# Patient Record
Sex: Female | Born: 1980 | Race: White | Hispanic: No | Smoking: Former smoker
Health system: Southern US, Community
[De-identification: ages and names within clinical notes are randomized; demographics above are authoritative.]

## PROBLEM LIST (undated history)

## (undated) DIAGNOSIS — R569 Unspecified convulsions: Secondary | ICD-10-CM

## (undated) DIAGNOSIS — F32A Depression, unspecified: Secondary | ICD-10-CM

## (undated) DIAGNOSIS — F419 Anxiety disorder, unspecified: Secondary | ICD-10-CM

## (undated) DIAGNOSIS — K859 Acute pancreatitis without necrosis or infection, unspecified: Secondary | ICD-10-CM

## (undated) DIAGNOSIS — N289 Disorder of kidney and ureter, unspecified: Secondary | ICD-10-CM

## (undated) DIAGNOSIS — K5790 Diverticulosis of intestine, part unspecified, without perforation or abscess without bleeding: Secondary | ICD-10-CM

## (undated) DIAGNOSIS — E2740 Unspecified adrenocortical insufficiency: Secondary | ICD-10-CM

## (undated) DIAGNOSIS — F329 Major depressive disorder, single episode, unspecified: Secondary | ICD-10-CM

## (undated) DIAGNOSIS — N159 Renal tubulo-interstitial disease, unspecified: Secondary | ICD-10-CM

## (undated) HISTORY — DX: Renal tubulo-interstitial disease, unspecified: N15.9

## (undated) HISTORY — DX: Depression, unspecified: F32.A

## (undated) HISTORY — DX: Anxiety disorder, unspecified: F41.9

## (undated) HISTORY — PX: OTHER SURGICAL HISTORY: SHX169

## (undated) HISTORY — DX: Unspecified convulsions: R56.9

## (undated) HISTORY — DX: Major depressive disorder, single episode, unspecified: F32.9

## (undated) HISTORY — PX: NO PAST SURGERIES: SHX2092

---

## 2004-11-19 ENCOUNTER — Ambulatory Visit: Payer: Self-pay | Admitting: Obstetrics & Gynecology

## 2010-05-26 DIAGNOSIS — N289 Disorder of kidney and ureter, unspecified: Secondary | ICD-10-CM

## 2010-05-26 HISTORY — DX: Disorder of kidney and ureter, unspecified: N28.9

## 2011-06-11 ENCOUNTER — Emergency Department: Payer: Self-pay | Admitting: Emergency Medicine

## 2011-06-12 LAB — PREGNANCY, URINE: Pregnancy Test, Urine: NEGATIVE m[IU]/mL

## 2011-06-12 LAB — URINALYSIS, COMPLETE
Glucose,UR: NEGATIVE mg/dL (ref 0–75)
Ketone: NEGATIVE
Protein: 30
RBC,UR: 438 /HPF (ref 0–5)
Specific Gravity: 1.014 (ref 1.003–1.030)

## 2011-06-19 ENCOUNTER — Ambulatory Visit: Payer: Self-pay | Admitting: Internal Medicine

## 2011-06-19 ENCOUNTER — Other Ambulatory Visit: Payer: Self-pay | Admitting: Internal Medicine

## 2011-06-19 LAB — BASIC METABOLIC PANEL
Creatinine: 0.6 mg/dL (ref 0.60–1.30)
EGFR (African American): >60
EGFR (Non-African Amer.): >60
Glucose: 59 mg/dL — ABNORMAL LOW (ref 65–99)
Potassium: 4.1 mmol/L (ref 3.5–5.1)
Sodium: 142 mmol/L (ref 136–145)

## 2011-11-10 ENCOUNTER — Emergency Department (HOSPITAL_COMMUNITY)
Admission: EM | Admit: 2011-11-10 | Discharge: 2011-11-11 | Disposition: A | Discharge status: Home / Self Care | Payer: Self-pay | Attending: Emergency Medicine | Admitting: Emergency Medicine

## 2011-11-10 ENCOUNTER — Encounter (HOSPITAL_COMMUNITY): Payer: Self-pay | Admitting: *Deleted

## 2011-11-10 ENCOUNTER — Emergency Department (HOSPITAL_COMMUNITY): Payer: Self-pay

## 2011-11-10 DIAGNOSIS — N201 Calculus of ureter: Secondary | ICD-10-CM

## 2011-11-10 DIAGNOSIS — R109 Unspecified abdominal pain: Secondary | ICD-10-CM

## 2011-11-10 DIAGNOSIS — F17200 Nicotine dependence, unspecified, uncomplicated: Secondary | ICD-10-CM | POA: Insufficient documentation

## 2011-11-10 LAB — URINE MICROSCOPIC-ADD ON

## 2011-11-10 LAB — DIFFERENTIAL
Eosinophils Absolute: 0.2 10*3/uL (ref 0.0–0.7)
Eosinophils Relative: 2 % (ref 0–5)
Lymphocytes Relative: 30 % (ref 12–46)
Lymphs Abs: 2.7 10*3/uL (ref 0.7–4.0)
Monocytes Absolute: 0.6 10*3/uL (ref 0.1–1.0)

## 2011-11-10 LAB — CBC
HCT: 39.6 % (ref 36.0–46.0)
MCH: 31.1 pg (ref 26.0–34.0)
MCV: 91.2 fL (ref 78.0–100.0)
Platelets: 287 10*3/uL (ref 150–400)
RBC: 4.34 MIL/uL (ref 3.87–5.11)
RDW: 13.2 % (ref 11.5–15.5)
WBC: 8.9 10*3/uL (ref 4.0–10.5)

## 2011-11-10 LAB — COMPREHENSIVE METABOLIC PANEL
BUN: 12 mg/dL (ref 6–23)
CO2: 28 mEq/L (ref 19–32)
Calcium: 9.4 mg/dL (ref 8.4–10.5)
Creatinine, Ser: 0.68 mg/dL (ref 0.50–1.10)
GFR calc Af Amer: >90 mL/min (ref >90)
GFR calc non Af Amer: >90 mL/min (ref >90)
Glucose, Bld: 73 mg/dL (ref 70–99)
Total Protein: 7 g/dL (ref 6.0–8.3)

## 2011-11-10 LAB — URINALYSIS, ROUTINE W REFLEX MICROSCOPIC
Bilirubin Urine: NEGATIVE
Hgb urine dipstick: NEGATIVE
Ketones, ur: NEGATIVE mg/dL
Nitrite: NEGATIVE
Specific Gravity, Urine: 1.015 (ref 1.005–1.030)
Urobilinogen, UA: 0.2 mg/dL (ref 0.0–1.0)

## 2011-11-10 MED ORDER — HYDROCODONE-ACETAMINOPHEN 5-500 MG PO TABS: 1.0000 | ORAL_TABLET | Freq: Four times a day (QID) | ORAL | Status: AC | PRN

## 2011-11-10 MED ORDER — KETOROLAC TROMETHAMINE 60 MG/2ML IM SOLN
60.0000 mg | Freq: Once | INTRAMUSCULAR | Status: AC
  Administered 2011-11-10: 60 mg via INTRAMUSCULAR
  Filled 2011-11-10: qty 2

## 2011-11-10 NOTE — ED Provider Notes (Signed)
I saw and evaluated the patient, reviewed the resident's note and I agree with the findings and plan.   Pt with flank pain off and on for several weeks/month. Irregular BM. Labs unremarkable. CT to eval for stone.   Martine Trageser B. Bernette Mayers, MD 11/10/11 2330

## 2011-11-10 NOTE — ED Provider Notes (Signed)
History     CSN: [id number]  Arrival date & time 11/10/11  1527   First MD Initiated Contact with Patient 11/10/11 2110      Chief Complaint  Patient presents with  . Flank Pain    (Consider location/radiation/quality/duration/timing/severity/associated sxs/prior treatment) Patient is a 31 y.o. female presenting with flank pain. The history is provided by the patient.  Flank Pain This is a new problem. The current episode started more than 1 month ago (worse the past several days). The problem occurs 2 to 4 times per day. The problem has been gradually worsening. Pertinent negatives include no chest pain, chills, coughing, fever or neck pain. Nothing aggravates the symptoms. She has tried acetaminophen for the symptoms. The treatment provided no relief.    History reviewed. No pertinent past medical history.  History reviewed. No pertinent past surgical history.  No family history on file.  History  Substance Use Topics  . Smoking status: Current Everyday Smoker  . Smokeless tobacco: Not on file  . Alcohol Use: Yes    OB History    Grav Para Term Preterm Abortions TAB SAB Ect Mult Living                  Review of Systems  Constitutional: Negative for fever, chills, activity change and appetite change.  HENT: Negative for neck pain and neck stiffness.   Respiratory: Negative for cough, chest tightness, shortness of breath and wheezing.   Cardiovascular: Negative for chest pain and palpitations.  Genitourinary: Positive for flank pain (right-sided). Negative for urgency, hematuria, decreased urine volume, difficulty urinating and menstrual problem.  Skin: Negative for wound.  Neurological: Negative for seizures, syncope and facial asymmetry.  Psychiatric/Behavioral: Negative for confusion and agitation.  All other systems reviewed and are negative.    Allergies  Sulfa antibiotics  Home Medications   Current Outpatient Rx  Name Route Sig Dispense Refill  .  ACETAMINOPHEN 325 MG PO TABS Oral Take 650 mg by mouth every 6 (six) hours as needed. For pain    . BUSPIRONE HCL 10 MG PO TABS Oral Take 10 mg by mouth 3 (three) times daily.    Marland Kitchen CITALOPRAM HYDROBROMIDE 20 MG PO TABS Oral Take 40 mg by mouth daily.      BP 121/75  Pulse 90  Temp 98.7 F (37.1 C) (Oral)  Resp 18  SpO2 100%  LMP 10/30/2011  Physical Exam  Nursing note and vitals reviewed. Constitutional: She is oriented to person, place, and time. She appears well-developed and well-nourished.  HENT:  Head: Normocephalic and atraumatic.  Right Ear: External ear normal.  Left Ear: External ear normal.  Nose: Nose normal.  Mouth/Throat: Oropharynx is clear and moist. No oropharyngeal exudate.  Eyes: Conjunctivae are normal. Pupils are equal, round, and reactive to light.  Neck: Normal range of motion. Neck supple.  Cardiovascular: Normal rate, regular rhythm, normal heart sounds and intact distal pulses.  Exam reveals no gallop and no friction rub.   No murmur heard. Pulmonary/Chest: Effort normal and breath sounds normal. No respiratory distress. She has no wheezes. She has no rales. She exhibits no tenderness.  Abdominal: Soft. Bowel sounds are normal. She exhibits no distension and no mass. There is tenderness (right flank). There is no rebound and no guarding.  Musculoskeletal: Normal range of motion. She exhibits no edema and no tenderness.  Neurological: She is alert and oriented to person, place, and time.  Skin: Skin is warm and dry.  Psychiatric: She has  a normal mood and affect.    ED Course  Procedures (including critical care time)  Labs Reviewed  URINALYSIS, ROUTINE W REFLEX MICROSCOPIC - Abnormal; Notable for the following:    APPearance HAZY (*)     Leukocytes, UA SMALL (*)     All other components within normal limits  URINE MICROSCOPIC-ADD ON - Abnormal; Notable for the following:    Squamous Epithelial / LPF FEW (*)     Bacteria, UA MANY (*)     All other  components within normal limits  PREGNANCY, URINE  CBC  DIFFERENTIAL  COMPREHENSIVE METABOLIC PANEL   Ct Abdomen Pelvis Wo Contrast  11/10/2011  *RADIOLOGY REPORT*  Clinical Data: Right flank pain and right rib pain.  Mild bilateral lower quadrant abdominal pain.  CT ABDOMEN AND PELVIS WITHOUT CONTRAST  Technique:  Multidetector CT imaging of the abdomen and pelvis was performed following the standard protocol without intravenous contrast.  Comparison: None.  Findings: The visualized lung bases are clear.  The liver and spleen are unremarkable in appearance.  The gallbladder is within normal limits.  The pancreas and adrenal glands are unremarkable.  A likely tiny nonobstructing 2 mm stone is noted at the interpole region of the right kidney.  The kidneys are otherwise unremarkable in appearance.  There is no evidence of hydronephrosis.  No obstructing ureteral stones are seen.  No perinephric stranding is appreciated.  No free fluid is identified.  The small bowel is unremarkable in appearance.  The stomach is within normal limits.  No acute vascular abnormalities are seen.  The appendix is normal in caliber and contains minimal air, without evidence for appendicitis.  Scattered diverticulosis is noted along the entirety of the colon, without evidence of diverticulitis.  The distal sigmoid colon is difficult to fully assess due to adjacent structures.  The bladder is mildly distended and grossly unremarkable in appearance.  The uterus is grossly normal in appearance.  The ovaries are relatively symmetric.  No suspicious adnexal masses are seen.  No inguinal lymphadenopathy is seen.  No acute osseous abnormalities are identified.  IMPRESSION:  1.  No acute abnormalities seen within the abdomen or pelvis. 2.  Likely tiny nonobstructing 2 mm stone at the interpole region of the right kidney. 3.  Scattered diverticulosis along the entirety of the colon, without evidence of diverticulitis.  Original Report  Authenticated By: Tonia Ghent, M.D.     1. Ureterolithiasis   2. Abdominal pain       MDM  31 yo F w/hx of recent ureterolithiasis presents with R flank pain with radiation to groin. TTP localized to right flank. U/A not c/w UTI; urine hCG negative. Labs not c/w acute renal failure. Imaging c/w non-obstructing 2mm right-sided nephrolithiasis. Pt already has strainer at home. Pt encouraged to drink more fluids, to strain urine, and to f/u with Urology regarding recurrent episodes of nephrolithiasis. Patient given return precautions, including worsening of signs or symptoms. Patient instructed to follow-up with primary care physician.         Clemetine Marker, MD 11/11/11 [phone]

## 2011-11-10 NOTE — ED Notes (Signed)
Patient denies nausea at this time.  Patient reports onset of pain 6 months ago intermittently.

## 2011-11-10 NOTE — ED Notes (Signed)
Family at bedside. 

## 2011-11-10 NOTE — Discharge Instructions (Signed)
Kidney Stones Kidney stones (ureteral lithiasis) are solid masses that form inside your kidneys. The intense pain is caused by the stone moving through the kidney, ureter, bladder, and urethra (urinary tract). When the stone moves, the ureter starts to spasm around the stone. The stone is usually passed in the urine.  HOME CARE  Drink enough fluids to keep your pee (urine) clear or pale yellow. This helps to get the stone out.   Strain all pee through the provided strainer. Do not pee without peeing through the strainer, not even once. If you pee the stone out, catch it. The stone may be as small as a grain of salt. Take this to your doctor.   Only take medicine as told by your doctor.   Follow up with your doctor as told.   Get follow-up X-rays as told by your doctor.  GET HELP RIGHT AWAY IF:   Your pain does not get better with medicine.   You have a fever.   Your pain increases and gets worse over 18 hours.   You have new belly (abdominal) pain.   You feel faint or pass out.  MAKE SURE YOU:   Understand these instructions.   Will watch your condition.   Will get help right away if you are not doing well or get worse.  Document Released: 10/29/2007 Document Revised: 05/01/2011 Document Reviewed: 03/09/2009 [facility] Patient Information 2012 [place], [place].Ureteral Colic (Kidney Stones) Ureteral colic is the result of a condition when kidney stones form inside the kidney. Once kidney stones are formed they may move into the tube that connects the kidney with the bladder (ureter). If this occurs, this condition may cause pain (colic) in the ureter.  CAUSES  Pain is caused by stone movement in the ureter and the obstruction caused by the stone. SYMPTOMS  The pain comes and goes as the ureter contracts around the stone. The pain is usually intense, sharp, and stabbing in character. The location of the pain may move as the stone moves through the ureter. When the stone is near the  kidney the pain is usually located in the back and radiates to the belly (abdomen). When the stone is ready to pass into the bladder the pain is often located in the lower abdomen on the side the stone is located. At this location, the symptoms may mimic those of a urinary tract infection with urinary frequency. Once the stone is located here it often passes into the bladder and the pain disappears completely. TREATMENT   Your caregiver will provide you with medicine for pain relief.   You may require specialized follow-up X-rays.   The absence of pain does not always mean that the stone has passed. It may have just stopped moving. If the urine remains completely obstructed, it can cause loss of kidney function or even complete destruction of the involved kidney. It is your responsibility and in your interest that X-rays and follow-ups as suggested by your caregiver are completed. Relief of pain without passage of the stone can be associated with severe damage to the kidney, including loss of kidney function on that side.   If your stone does not pass on its own, additional measures may be taken by your caregiver to ensure its removal.  HOME CARE INSTRUCTIONS   Increase your fluid intake. Water is the preferred fluid since juices containing vitamin C may acidify the urine making it less likely for certain stones (uric acid stones) to pass.   Strain  all urine. A strainer will be provided. Keep all particulate matter or stones for your caregiver to inspect.   Take your pain medicine as directed.   Make a follow-up appointment with your caregiver as directed.   Remember that the goal is passage of your stone. The absence of pain does not mean the stone is gone. Follow your caregiver's instructions.   Only take over-the-counter or prescription medicines for pain, discomfort, or fever as directed by your caregiver.  SEEK MEDICAL CARE IF:   Pain cannot be controlled with the prescribed medicine.     You have a fever.   Pain continues for longer than your caregiver advises it should.   There is a change in the pain, and you develop chest discomfort or constant abdominal pain.   You feel faint or pass out.  MAKE SURE YOU:   Understand these instructions.   Will watch your condition.   Will get help right away if you are not doing well or get worse.  Document Released: 02/19/2005 Document Revised: 05/01/2011 Document Reviewed: 11/06/2010 [facility] Patient Information 2012 [place], [place].Urine Strainer This strainer is used to catch or filter out any stones found in your urine. Place the strainer under your urine stream. Save any stones or objects that you find in your urine. Place them in a plastic or glass container to show your caregiver. The stones vary in size - some can be very small, so make sure you check the strainer carefully. Your caregiver may send the stone to the lab. When the results are back, your caregiver may recommend medicines or diet changes.  Document Released: 02/15/2004 Document Revised: 05/01/2011 Document Reviewed: 03/24/2008 [facility] Patient Information 2012 [place], [place].

## 2011-11-10 NOTE — ED Notes (Signed)
Patient transported to and from CT.

## 2011-11-10 NOTE — ED Notes (Signed)
For 6 months she has had rt flank pain rib pain no energy irregular bms some lower abd pain bi-lateral.  lmp June 6th

## 2011-11-11 NOTE — ED Notes (Signed)
Patient given instructions, voiced understanding.  States she has a strain at home

## 2012-03-26 DIAGNOSIS — K859 Acute pancreatitis without necrosis or infection, unspecified: Secondary | ICD-10-CM

## 2012-03-26 HISTORY — DX: Acute pancreatitis without necrosis or infection, unspecified: K85.90

## 2012-03-31 ENCOUNTER — Encounter (HOSPITAL_COMMUNITY): Payer: Self-pay | Admitting: *Deleted

## 2012-03-31 DIAGNOSIS — Z87442 Personal history of urinary calculi: Secondary | ICD-10-CM | POA: Insufficient documentation

## 2012-03-31 DIAGNOSIS — K859 Acute pancreatitis without necrosis or infection, unspecified: Secondary | ICD-10-CM | POA: Insufficient documentation

## 2012-03-31 DIAGNOSIS — F17200 Nicotine dependence, unspecified, uncomplicated: Secondary | ICD-10-CM | POA: Insufficient documentation

## 2012-03-31 DIAGNOSIS — Z8719 Personal history of other diseases of the digestive system: Secondary | ICD-10-CM | POA: Insufficient documentation

## 2012-03-31 NOTE — ED Notes (Signed)
Pt to ED c/o R sided abd pain and constipation x 1 week (usu bm once a day).  Pt c/o nausea, but denies emesis.  States hx of diverticulosis "all over" and this pain feels the same, but worse.  Also hx of kidney stones.

## 2012-04-01 ENCOUNTER — Emergency Department (HOSPITAL_COMMUNITY): Payer: Self-pay

## 2012-04-01 ENCOUNTER — Emergency Department (HOSPITAL_COMMUNITY)
Admission: EM | Admit: 2012-04-01 | Discharge: 2012-04-01 | Disposition: A | Discharge status: Home / Self Care | Payer: Self-pay | Attending: Emergency Medicine | Admitting: Emergency Medicine

## 2012-04-01 DIAGNOSIS — K859 Acute pancreatitis without necrosis or infection, unspecified: Secondary | ICD-10-CM

## 2012-04-01 HISTORY — DX: Disorder of kidney and ureter, unspecified: N28.9

## 2012-04-01 HISTORY — DX: Diverticulosis of intestine, part unspecified, without perforation or abscess without bleeding: K57.90

## 2012-04-01 LAB — CBC WITH DIFFERENTIAL/PLATELET
Basophils Absolute: 0.1 10*3/uL (ref 0.0–0.1)
Basophils Relative: 1 % (ref 0–1)
Eosinophils Absolute: 0.4 10*3/uL (ref 0.0–0.7)
Eosinophils Relative: 3 % (ref 0–5)
HCT: 38.4 % (ref 36.0–46.0)
Hemoglobin: 13.5 g/dL (ref 12.0–15.0)
Lymphocytes Relative: 35 % (ref 12–46)
Lymphs Abs: 3.7 10*3/uL (ref 0.7–4.0)
MCH: 32.1 pg (ref 26.0–34.0)
MCHC: 35.2 g/dL (ref 30.0–36.0)
MCV: 91.2 fL (ref 78.0–100.0)
Monocytes Absolute: 1.3 10*3/uL — ABNORMAL HIGH (ref 0.1–1.0)
Monocytes Relative: 12 % (ref 3–12)
Neutro Abs: 5.2 10*3/uL (ref 1.7–7.7)
Neutrophils Relative %: 49 % (ref 43–77)
Platelets: 263 10*3/uL (ref 150–400)
RBC: 4.21 MIL/uL (ref 3.87–5.11)
RDW: 13 % (ref 11.5–15.5)
WBC: 10.6 10*3/uL — ABNORMAL HIGH (ref 4.0–10.5)

## 2012-04-01 LAB — URINALYSIS, ROUTINE W REFLEX MICROSCOPIC
Bilirubin Urine: NEGATIVE
Glucose, UA: NEGATIVE mg/dL
Hgb urine dipstick: NEGATIVE
Ketones, ur: NEGATIVE mg/dL
Leukocytes, UA: NEGATIVE
Nitrite: NEGATIVE
Protein, ur: NEGATIVE mg/dL
Specific Gravity, Urine: 1.012 (ref 1.005–1.030)
Urobilinogen, UA: 1 mg/dL (ref 0.0–1.0)
pH: 8 (ref 5.0–8.0)

## 2012-04-01 LAB — COMPREHENSIVE METABOLIC PANEL
ALT: 13 U/L (ref 0–35)
BUN: 10 mg/dL (ref 6–23)
Calcium: 9.1 mg/dL (ref 8.4–10.5)
GFR calc Af Amer: >90 mL/min (ref >90)
Glucose, Bld: 96 mg/dL (ref 70–99)
Sodium: 134 mEq/L — ABNORMAL LOW (ref 135–145)
Total Protein: 6.9 g/dL (ref 6.0–8.3)

## 2012-04-01 LAB — POCT PREGNANCY, URINE: Preg Test, Ur: NEGATIVE

## 2012-04-01 LAB — LIPASE, BLOOD: Lipase: 77 U/L — ABNORMAL HIGH (ref 11–59)

## 2012-04-01 MED ORDER — SODIUM CHLORIDE 0.9 % IV BOLUS (SEPSIS)
1000.0000 mL | Freq: Once | INTRAVENOUS | Status: AC
  Administered 2012-04-01: 1000 mL via INTRAVENOUS

## 2012-04-01 MED ORDER — SODIUM CHLORIDE 0.9 % IV SOLN
INTRAVENOUS | Status: DC
  Administered 2012-04-01: 02:00:00 via INTRAVENOUS

## 2012-04-01 MED ORDER — HYDROMORPHONE HCL PF 1 MG/ML IJ SOLN
0.5000 mg | Freq: Once | INTRAMUSCULAR | Status: AC
  Administered 2012-04-01: 0.5 mg via INTRAVENOUS
  Filled 2012-04-01: qty 1

## 2012-04-01 MED ORDER — ONDANSETRON HCL 8 MG PO TABS: 8.0000 mg | ORAL_TABLET | ORAL | Status: DC | PRN

## 2012-04-01 MED ORDER — ONDANSETRON HCL 4 MG/2ML IJ SOLN
4.0000 mg | Freq: Once | INTRAMUSCULAR | Status: AC
  Administered 2012-04-01: 4 mg via INTRAVENOUS
  Filled 2012-04-01: qty 2

## 2012-04-01 MED ORDER — OXYCODONE-ACETAMINOPHEN 5-325 MG PO TABS: 1.0000 | ORAL_TABLET | Freq: Four times a day (QID) | ORAL | Status: DC | PRN

## 2012-04-01 NOTE — ED Notes (Signed)
Pt dc to home with family.  Pt ambulatory to exit without difficulty.  Pt states understanding to dc instructions.

## 2012-04-01 NOTE — ED Provider Notes (Addendum)
History     CSN: [id number]  Arrival date & time 03/31/12  2341   First MD Initiated Contact with Patient 04/01/12 0016      Chief Complaint  Patient presents with  . Abdominal Pain    (Consider location/radiation/quality/duration/timing/severity/associated sxs/prior treatment) HPI..... right upper quadrant pain for several weeks.  Patient went home the past she has diverticulosis and renal stones.  Pain is intermittent and not associated with any activity. There is no radiation. Severity is mild or moderate. Nothing makes it better or worse.  No fever, chills, dysuria, weight loss, nausea, vomiting, diarrhea. She smokes cigarettes. Minimal drinking  Past Medical History  Diagnosis Date  . Renal disorder     renal calculi  . Diverticulosis     History reviewed. No pertinent past surgical history.  No family history on file.  History  Substance Use Topics  . Smoking status: Current Every Day Smoker -- 0.5 packs/day    Types: Cigarettes  . Smokeless tobacco: Not on file  . Alcohol Use: Yes     Comment: occasionally    OB History    Grav Para Term Preterm Abortions TAB SAB Ect Mult Living                  Review of Systems  All other systems reviewed and are negative.    Allergies  Sulfa antibiotics  Home Medications   Current Outpatient Rx  Name  Route  Sig  Dispense  Refill  . ACETAMINOPHEN 325 MG PO TABS   Oral   Take 650 mg by mouth every 6 (six) hours as needed. For pain         . BUSPIRONE HCL 10 MG PO TABS   Oral   Take 10 mg by mouth 3 (three) times daily.         Marland Kitchen CITALOPRAM HYDROBROMIDE 20 MG PO TABS   Oral   Take 40 mg by mouth daily.           BP 116/83  Pulse 78  Temp 98.8 F (37.1 C) (Oral)  Resp 16  SpO2 100%  LMP 03/18/2012  Physical Exam  Nursing note and vitals reviewed. Constitutional: She is oriented to person, place, and time. She appears well-developed and well-nourished.  HENT:  Head: Normocephalic and  atraumatic.  Eyes: Conjunctivae normal and EOM are normal. Pupils are equal, round, and reactive to light.  Neck: Normal range of motion. Neck supple.  Cardiovascular: Normal rate, regular rhythm and normal heart sounds.   Pulmonary/Chest: Effort normal and breath sounds normal.  Abdominal: Soft. Bowel sounds are normal.       Minimal right upper quadrant tenderness  Musculoskeletal: Normal range of motion.  Neurological: She is alert and oriented to person, place, and time.  Skin: Skin is warm and dry.  Psychiatric: She has a normal mood and affect.    ED Course  Procedures (including critical care time)  Labs Reviewed  CBC WITH DIFFERENTIAL - Abnormal; Notable for the following:    WBC 10.6 (*)     Monocytes Absolute 1.3 (*)     All other components within normal limits  COMPREHENSIVE METABOLIC PANEL - Abnormal; Notable for the following:    Sodium 134 (*)     All other components within normal limits  LIPASE, BLOOD - Abnormal; Notable for the following:    Lipase 77 (*)     All other components within normal limits  URINALYSIS, ROUTINE W REFLEX MICROSCOPIC  POCT PREGNANCY,  URINE    US Abdomen Complete  04/01/2012  *RADIOLOGY REPORT*  Clinical Data:  Right upper quadrant abdominal pain.  ABDOMINAL ULTRASOUND COMPLETE  Comparison:  CT of the abdomen and pelvis performed 11/10/2011  Findings:  Gallbladder:  The gallbladder is normal in appearance, without evidence for gallstones, gallbladder wall thickening or pericholecystic fluid.  No ultrasonographic Murphy's sign is elicited.  Common Bile Duct:  0.6 cm in diameter; within normal limits in caliber.  Liver:  Normal parenchymal echogenicity and echotexture; no focal lesions identified.  Limited Doppler evaluation demonstrates normal blood flow within the liver.  IVC:  Unremarkable in appearance.  Pancreas:  Although the pancreas is difficult to visualize in its entirety due to overlying bowel gas, no focal pancreatic abnormality is  identified.  Spleen:  9.6 cm in length; within normal limits in size and echotexture.  Right kidney:  11.5 cm in length; normal in size, configuration and parenchymal echogenicity.  No evidence of mass or hydronephrosis.  Left kidney:  10.5 cm in length; normal in size, configuration and parenchymal echogenicity.  No evidence of mass or hydronephrosis.  Abdominal Aorta:  Normal in caliber; no aneurysm identified.  IMPRESSION: Unremarkable abdominal ultrasound.   Original Report Authenticated By: Tonia Ghent, M.D.    No results found.   No diagnosis found.    MDM  Will obtain ultrasound to rule out cholelithiasis  Recheck at 1600: No acute abdomen. Ultrasound shows no acute findings. Lipase is slightly elevated. I discussed findings of mild pancreatitis with patient and her female friend. Advised to not drink alcohol. Rx for Percocet #20 and Zofran 8 mg #10      Donnetta Hutching, MD 04/01/12 4098  Donnetta Hutching, MD 04/01/12 [phone]

## 2012-09-16 ENCOUNTER — Emergency Department (HOSPITAL_COMMUNITY): Payer: Self-pay

## 2012-09-16 ENCOUNTER — Inpatient Hospital Stay (HOSPITAL_COMMUNITY)
Admission: EM | Admit: 2012-09-16 | Discharge: 2012-09-18 | DRG: 871 | Disposition: A | Discharge status: Home / Self Care | Payer: MEDICAID | Attending: Internal Medicine | Admitting: Internal Medicine

## 2012-09-16 ENCOUNTER — Inpatient Hospital Stay (HOSPITAL_COMMUNITY): Payer: Self-pay

## 2012-09-16 ENCOUNTER — Encounter (HOSPITAL_COMMUNITY): Payer: Self-pay | Admitting: Emergency Medicine

## 2012-09-16 DIAGNOSIS — R7989 Other specified abnormal findings of blood chemistry: Secondary | ICD-10-CM

## 2012-09-16 DIAGNOSIS — F1210 Cannabis abuse, uncomplicated: Secondary | ICD-10-CM | POA: Diagnosis present

## 2012-09-16 DIAGNOSIS — N12 Tubulo-interstitial nephritis, not specified as acute or chronic: Secondary | ICD-10-CM

## 2012-09-16 DIAGNOSIS — E871 Hypo-osmolality and hyponatremia: Secondary | ICD-10-CM

## 2012-09-16 DIAGNOSIS — D72829 Elevated white blood cell count, unspecified: Secondary | ICD-10-CM

## 2012-09-16 DIAGNOSIS — D509 Iron deficiency anemia, unspecified: Secondary | ICD-10-CM | POA: Diagnosis present

## 2012-09-16 DIAGNOSIS — A4151 Sepsis due to Escherichia coli [E. coli]: Principal | ICD-10-CM | POA: Diagnosis present

## 2012-09-16 DIAGNOSIS — Z8719 Personal history of other diseases of the digestive system: Secondary | ICD-10-CM

## 2012-09-16 DIAGNOSIS — K861 Other chronic pancreatitis: Secondary | ICD-10-CM | POA: Diagnosis present

## 2012-09-16 DIAGNOSIS — A419 Sepsis, unspecified organism: Secondary | ICD-10-CM

## 2012-09-16 DIAGNOSIS — Z87891 Personal history of nicotine dependence: Secondary | ICD-10-CM

## 2012-09-16 DIAGNOSIS — R6521 Severe sepsis with septic shock: Secondary | ICD-10-CM | POA: Diagnosis present

## 2012-09-16 DIAGNOSIS — D649 Anemia, unspecified: Secondary | ICD-10-CM | POA: Diagnosis present

## 2012-09-16 DIAGNOSIS — N134 Hydroureter: Secondary | ICD-10-CM | POA: Diagnosis present

## 2012-09-16 DIAGNOSIS — E2740 Unspecified adrenocortical insufficiency: Secondary | ICD-10-CM

## 2012-09-16 DIAGNOSIS — I959 Hypotension, unspecified: Secondary | ICD-10-CM | POA: Diagnosis present

## 2012-09-16 DIAGNOSIS — R6510 Systemic inflammatory response syndrome (SIRS) of non-infectious origin without acute organ dysfunction: Secondary | ICD-10-CM

## 2012-09-16 DIAGNOSIS — N10 Acute tubulo-interstitial nephritis: Secondary | ICD-10-CM

## 2012-09-16 DIAGNOSIS — Z87442 Personal history of urinary calculi: Secondary | ICD-10-CM

## 2012-09-16 HISTORY — DX: Acute pancreatitis without necrosis or infection, unspecified: K85.90

## 2012-09-16 LAB — CBC WITH DIFFERENTIAL/PLATELET
Basophils Absolute: 0 10*3/uL (ref 0.0–0.1)
Basophils Relative: 0 % (ref 0–1)
HCT: 32.4 % — ABNORMAL LOW (ref 36.0–46.0)
Lymphs Abs: 0.9 10*3/uL (ref 0.7–4.0)
MCV: 89.5 fL (ref 78.0–100.0)
Monocytes Relative: 7 % (ref 3–12)
Neutro Abs: 27 10*3/uL — ABNORMAL HIGH (ref 1.7–7.7)
RDW: 13.7 % (ref 11.5–15.5)
WBC: 30 10*3/uL — ABNORMAL HIGH (ref 4.0–10.5)

## 2012-09-16 LAB — COMPREHENSIVE METABOLIC PANEL
Alkaline Phosphatase: 78 U/L (ref 39–117)
BUN: 9 mg/dL (ref 6–23)
BUN: 7 mg/dL (ref 6–23)
CO2: 24 mEq/L (ref 19–32)
CO2: 22 mEq/L (ref 19–32)
Calcium: 7.5 mg/dL — ABNORMAL LOW (ref 8.4–10.5)
Chloride: 85 mEq/L — ABNORMAL LOW (ref 96–112)
Chloride: 107 mEq/L (ref 96–112)
Creatinine, Ser: 0.57 mg/dL (ref 0.50–1.10)
GFR calc Af Amer: >90 mL/min (ref >90)
GFR calc Af Amer: >90 mL/min (ref >90)
GFR calc non Af Amer: >90 mL/min (ref >90)
GFR calc non Af Amer: >90 mL/min (ref >90)
Glucose, Bld: 177 mg/dL — ABNORMAL HIGH (ref 70–99)
Glucose, Bld: 123 mg/dL — ABNORMAL HIGH (ref 70–99)
Potassium: 3.5 mEq/L (ref 3.5–5.1)
Total Bilirubin: 1 mg/dL (ref 0.3–1.2)
Total Bilirubin: 0.5 mg/dL (ref 0.3–1.2)
Total Protein: 6.8 g/dL (ref 6.0–8.3)

## 2012-09-16 LAB — CARBOXYHEMOGLOBIN
Methemoglobin: 1.8 % — ABNORMAL HIGH (ref 0.0–1.5)
Total hemoglobin: 9.8 g/dL — ABNORMAL LOW (ref 12.0–16.0)

## 2012-09-16 LAB — CORTISOL: Cortisol, Plasma: 7.2 ug/dL

## 2012-09-16 LAB — TYPE AND SCREEN
ABO/RH(D): B POS
Antibody Screen: NEGATIVE

## 2012-09-16 LAB — URINALYSIS, ROUTINE W REFLEX MICROSCOPIC
Bilirubin Urine: NEGATIVE
Glucose, UA: NEGATIVE mg/dL
Ketones, ur: 40 mg/dL — AB
Protein, ur: 100 mg/dL — AB
Urobilinogen, UA: 1 mg/dL (ref 0.0–1.0)

## 2012-09-16 LAB — URINE MICROSCOPIC-ADD ON

## 2012-09-16 LAB — LIPASE, BLOOD: Lipase: 19 U/L (ref 11–59)

## 2012-09-16 LAB — RAPID URINE DRUG SCREEN, HOSP PERFORMED
Amphetamines: NOT DETECTED
Benzodiazepines: NOT DETECTED
Opiates: NOT DETECTED

## 2012-09-16 LAB — PROCALCITONIN: Procalcitonin: 5.47 ng/mL

## 2012-09-16 MED ORDER — NOREPINEPHRINE BITARTRATE 1 MG/ML IJ SOLN
5.0000 ug/min | INTRAVENOUS | Status: DC
  Administered 2012-09-16: 5 ug/min via INTRAVENOUS
  Filled 2012-09-16: qty 4

## 2012-09-16 MED ORDER — HYDROMORPHONE HCL PF 1 MG/ML IJ SOLN
0.5000 mg | Freq: Once | INTRAMUSCULAR | Status: DC
  Filled 2012-09-16: qty 1

## 2012-09-16 MED ORDER — SODIUM CHLORIDE 0.9 % IV SOLN
Freq: Once | INTRAVENOUS | Status: AC
  Administered 2012-09-16: 75 mL/h via INTRAVENOUS

## 2012-09-16 MED ORDER — ENOXAPARIN SODIUM 40 MG/0.4ML ~~LOC~~ SOLN
40.0000 mg | SUBCUTANEOUS | Status: DC
  Filled 2012-09-16 (×4): qty 0.4

## 2012-09-16 MED ORDER — PHENAZOPYRIDINE HCL 200 MG PO TABS
200.0000 mg | ORAL_TABLET | Freq: Three times a day (TID) | ORAL | Status: DC
  Administered 2012-09-16: 200 mg via ORAL
  Filled 2012-09-16 (×4): qty 1

## 2012-09-16 MED ORDER — DEXTROSE 5 % IV SOLN: 2.0000 g | Freq: Once | INTRAVENOUS | Status: DC

## 2012-09-16 MED ORDER — ALBUTEROL SULFATE (5 MG/ML) 0.5% IN NEBU: 2.5000 mg | INHALATION_SOLUTION | RESPIRATORY_TRACT | Status: DC | PRN

## 2012-09-16 MED ORDER — ONDANSETRON HCL 4 MG PO TABS
4.0000 mg | ORAL_TABLET | Freq: Four times a day (QID) | ORAL | Status: DC | PRN
  Administered 2012-09-16: 4 mg via ORAL
  Filled 2012-09-16: qty 1

## 2012-09-16 MED ORDER — SODIUM CHLORIDE 0.9 % IV BOLUS (SEPSIS)
1000.0000 mL | Freq: Once | INTRAVENOUS | Status: AC
  Administered 2012-09-16: 1000 mL via INTRAVENOUS

## 2012-09-16 MED ORDER — ONDANSETRON HCL 4 MG/2ML IJ SOLN: 4.0000 mg | Freq: Four times a day (QID) | INTRAMUSCULAR | Status: DC | PRN

## 2012-09-16 MED ORDER — PROMETHAZINE HCL 25 MG/ML IJ SOLN
12.5000 mg | Freq: Once | INTRAMUSCULAR | Status: AC
  Administered 2012-09-16: 12.5 mg via INTRAVENOUS
  Filled 2012-09-16: qty 1

## 2012-09-16 MED ORDER — SODIUM CHLORIDE 0.9 % IJ SOLN
3.0000 mL | Freq: Two times a day (BID) | INTRAMUSCULAR | Status: DC
  Administered 2012-09-16 – 2012-09-17 (×2): 3 mL via INTRAVENOUS

## 2012-09-16 MED ORDER — OXYCODONE HCL 5 MG PO TABS
5.0000 mg | ORAL_TABLET | ORAL | Status: DC | PRN
  Administered 2012-09-16 – 2012-09-17 (×2): 5 mg via ORAL
  Filled 2012-09-16 (×2): qty 1

## 2012-09-16 MED ORDER — DEXTROSE 5 % IV SOLN
1.0000 g | INTRAVENOUS | Status: DC
  Administered 2012-09-17 – 2012-09-18 (×2): 1 g via INTRAVENOUS
  Filled 2012-09-16 (×2): qty 10

## 2012-09-16 MED ORDER — LACTATED RINGERS IV BOLUS (SEPSIS)
1000.0000 mL | Freq: Once | INTRAVENOUS | Status: AC
  Administered 2012-09-16: 1000 mL via INTRAVENOUS

## 2012-09-16 MED ORDER — KETOROLAC TROMETHAMINE 15 MG/ML IJ SOLN
15.0000 mg | Freq: Once | INTRAMUSCULAR | Status: AC
  Administered 2012-09-16: 15 mg via INTRAVENOUS
  Filled 2012-09-16 (×2): qty 1

## 2012-09-16 MED ORDER — ACETAMINOPHEN 325 MG PO TABS
650.0000 mg | ORAL_TABLET | Freq: Four times a day (QID) | ORAL | Status: DC | PRN
  Administered 2012-09-16 – 2012-09-18 (×4): 650 mg via ORAL
  Filled 2012-09-16 (×4): qty 2

## 2012-09-16 MED ORDER — SODIUM CHLORIDE 0.9 % IV BOLUS (SEPSIS)
1000.0000 mL | INTRAVENOUS | Status: DC | PRN
  Administered 2012-09-16 (×2): 1000 mL via INTRAVENOUS
  Administered 2012-09-17: 20 mL via INTRAVENOUS

## 2012-09-16 MED ORDER — DEXTROSE 5 % IV SOLN
2.0000 g | Freq: Once | INTRAVENOUS | Status: AC
  Administered 2012-09-16: 2 g via INTRAVENOUS
  Filled 2012-09-16: qty 2

## 2012-09-16 MED ORDER — HYDROMORPHONE HCL PF 1 MG/ML IJ SOLN
0.5000 mg | Freq: Once | INTRAMUSCULAR | Status: AC
  Administered 2012-09-16: 0.5 mg via INTRAVENOUS
  Filled 2012-09-16: qty 1

## 2012-09-16 MED ORDER — IOHEXOL 300 MG/ML  SOLN
100.0000 mL | Freq: Once | INTRAMUSCULAR | Status: AC | PRN
  Administered 2012-09-16: 100 mL via INTRAVENOUS

## 2012-09-16 MED ORDER — CIPROFLOXACIN HCL 500 MG PO TABS
500.0000 mg | ORAL_TABLET | Freq: Once | ORAL | Status: AC
  Administered 2012-09-16: 500 mg via ORAL
  Filled 2012-09-16: qty 1

## 2012-09-16 MED ORDER — SODIUM CHLORIDE 0.9 % IV SOLN
Freq: Once | INTRAVENOUS | Status: AC
  Administered 2012-09-16: 04:00:00 via INTRAVENOUS

## 2012-09-16 MED ORDER — ACETAMINOPHEN 650 MG RE SUPP: 650.0000 mg | Freq: Four times a day (QID) | RECTAL | Status: DC | PRN

## 2012-09-16 MED ORDER — IOHEXOL 300 MG/ML  SOLN
50.0000 mL | Freq: Once | INTRAMUSCULAR | Status: AC | PRN
  Administered 2012-09-16: 50 mL via ORAL

## 2012-09-16 MED ORDER — POTASSIUM CHLORIDE IN NACL 20-0.9 MEQ/L-% IV SOLN
INTRAVENOUS | Status: DC
  Administered 2012-09-16 – 2012-09-17 (×4): via INTRAVENOUS
  Filled 2012-09-16 (×6): qty 1000

## 2012-09-16 MED ORDER — HYDROMORPHONE HCL PF 1 MG/ML IJ SOLN
0.5000 mg | INTRAMUSCULAR | Status: DC | PRN
  Administered 2012-09-16 – 2012-09-17 (×2): 0.5 mg via INTRAVENOUS
  Filled 2012-09-16 (×2): qty 1

## 2012-09-16 MED ORDER — ACETAMINOPHEN 325 MG PO TABS
650.0000 mg | ORAL_TABLET | Freq: Once | ORAL | Status: AC
  Administered 2012-09-16: 650 mg via ORAL
  Filled 2012-09-16: qty 2

## 2012-09-16 MED ORDER — GI COCKTAIL ~~LOC~~
30.0000 mL | Freq: Once | ORAL | Status: DC
  Filled 2012-09-16: qty 30

## 2012-09-16 MED ORDER — ONDANSETRON HCL 4 MG/2ML IJ SOLN
4.0000 mg | Freq: Once | INTRAMUSCULAR | Status: AC
  Administered 2012-09-16: 4 mg via INTRAVENOUS
  Filled 2012-09-16: qty 2

## 2012-09-16 MED ORDER — BUSPIRONE HCL 10 MG PO TABS
20.0000 mg | ORAL_TABLET | Freq: Two times a day (BID) | ORAL | Status: DC
  Administered 2012-09-16 – 2012-09-18 (×5): 20 mg via ORAL
  Filled 2012-09-16 (×7): qty 2

## 2012-09-16 NOTE — Progress Notes (Signed)
eLink Physician-Brief Progress Note Patient Name: Paige Fields DOB: 27-Apr-1981 MRN: [id number]  Date of Service  09/16/2012   HPI/Events of Note  Patient admitted for sepsis secondary to pyelo - on sepsis protocol.  Current BP of 85/52 (60) with no resp distress. CVP is 8.   eICU Interventions  Plan: 1 liter of NS IV for BP support   Intervention Category Intermediate Interventions: Hypotension - evaluation and management  DETERDING,ELIZABETH 09/16/2012, 11:31 PM

## 2012-09-16 NOTE — Progress Notes (Signed)
CARE MANAGEMENT NOTE 09/16/2012  Patient:  Paige Fields, Paige Fields   Account Number:  [id number]  Date Initiated:  09/16/2012  Documentation initiated by:  DAVIS,RHONDA  Subjective/Objective Assessment:   pt with urospesis, temp 104 at home, wcb-30.0 on admit, hyponatremia,hypotensive,iv fld boluses     Action/Plan:   is normal inde in her care   Anticipated DC Date:  09/19/2012   Anticipated DC Plan:  HOME/SELF CARE  In-house referral  NA      DC Planning Services  NA      [facility] Choice  NA   Choice offered to / List presented to:  NA   DME arranged  NA      DME agency  NA     HH arranged  NA      HH agency  NA   Status of service:  In process, will continue to follow Medicare Important Message given?  NA - LOS <3 / Initial given by admissions (If response is "NO", the following Medicare IM given date fields will be blank) Date Medicare IM given:   Date Additional Medicare IM given:    Discharge Disposition:    Per UR Regulation:  Reviewed for med. necessity/level of care/duration of stay  If discussed at Long Length of Stay Meetings, dates discussed:    Comments:  [id number]/Rhonda Earlene Plater, RN, BSN, CCM:  CHART REVIEWED AND UPDATED.  Next chart review due on [id number]. NO DISCHARGE NEEDS PRESENT AT THIS TIME. CASE MANAGEMENT [phone]

## 2012-09-16 NOTE — ED Provider Notes (Signed)
History     CSN: [id number]  Arrival date & time 09/16/12  0400   First MD Initiated Contact with Patient 09/16/12 0448      Chief Complaint  Patient presents with  . Abdominal Pain    HPI Paige Fields is a 32 y.o. female presenting with left upper quadrant pain. Patient has had a history of renal calculi, diverticulosis as well as pancreatitis thought to be caused by medications (Celexa) which has been discontinued 2 weeks ago. Patient had an attack of pancreatitis last week she feels like this is a different type of pain.  This is a sharp pain in the left upper quadrant that radiates into the left flank, it is 9/10 in intensity, associated with nausea-patient has had one episode of watery vomit no blood, no bile. Patient has not had any diarrhea. Patient is feeling febrile with chills, no chest pain, shortness of breath, productive cough. Eating seems to worsen the pain. She says she has not had much alcohol since having a pancreatitis diagnosis but said she didn't drink much to begin with, she smokes marijuana occasionally, she says she also quit smoking. Denies any intravenous drug use.   Past Medical History  Diagnosis Date  . Renal disorder     renal calculi  . Diverticulosis   . Pancreatitis     History reviewed. No pertinent past surgical history.  No family history on file.  History  Substance Use Topics  . Smoking status: Former Smoker -- 0.50 packs/day  . Smokeless tobacco: Not on file  . Alcohol Use: No    OB History   Grav Para Term Preterm Abortions TAB SAB Ect Mult Living                  Review of Systems At least 10pt or greater review of systems completed and are negative except where specified in the HPI.  Allergies  Sulfa antibiotics  Home Medications   Current Outpatient Rx  Name  Route  Sig  Dispense  Refill  . acetaminophen (TYLENOL) 325 MG tablet   Oral   Take 650 mg by mouth every 6 (six) hours as needed. For pain         .  busPIRone (BUSPAR) 10 MG tablet   Oral   Take 20 mg by mouth 2 (two) times daily.          . citalopram (CELEXA) 20 MG tablet   Oral   Take 30 mg by mouth daily.          . ondansetron (ZOFRAN) 8 MG tablet   Oral   Take 1 tablet (8 mg total) by mouth every 4 (four) hours as needed for nausea.   10 tablet   0     BP 98/61  Pulse 112  Temp(Src) 100.7 F (38.2 C) (Oral)  Resp 21  Ht 5\' 5"  (1.651 m)  Wt 120 lb (54.432 kg)  BMI 19.97 kg/m2  SpO2 100%  LMP 09/14/2012  Physical Exam  Nursing notes reviewed.  Electronic medical record reviewed. VITAL SIGNS:   Filed Vitals:   09/16/12 0410  BP: 98/61  Pulse: 112  Temp: 100.7 F (38.2 C)  TempSrc: Oral  Resp: 21  Height: 5\' 5"  (1.651 m)  Weight: 120 lb (54.432 kg)  SpO2: 100%   CONSTITUTIONAL: Awake, oriented x4, appears ill and pale HENT: Atraumatic, normocephalic, oral mucosa pink and moist, airway patent. Nares patent without drainage. External ears normal. EYES: Conjunctiva clear, EOMI, PERRLA  NECK: Trachea midline, non-tender, supple CARDIOVASCULAR: Normal heart rate, Normal rhythm, No murmurs, rubs, gallops PULMONARY/CHEST: Clear to auscultation, no rhonchi, wheezes, or rales. Symmetrical breath sounds. Non-tender. ABDOMINAL: Non-distended, soft, very tender to palpation in the left upper quadrant with voluntary guarding and no rebound.  BS normal. NEUROLOGIC: Non-focal, moving all four extremities, no gross sensory or motor deficits. EXTREMITIES: No clubbing, cyanosis, or edema SKIN: Warm, Dry, No erythema, No rash, multiple tattoos  ED Course  Procedures (including critical care time)  Labs Reviewed  CBC WITH DIFFERENTIAL - Abnormal; Notable for the following:    WBC 30.0 (*)    RBC 3.62 (*)    Hemoglobin 11.4 (*)    HCT 32.4 (*)    All other components within normal limits  PREGNANCY, URINE  COMPREHENSIVE METABOLIC PANEL  LIPASE, BLOOD  TROPONIN I  URINALYSIS, ROUTINE W REFLEX MICROSCOPIC   Dg  Chest 2 View  09/16/2012  *RADIOLOGY REPORT*  Clinical Data: Left upper quadrant pain  CHEST - 2 VIEW  Comparison: 11/10/2011  Findings: Lungs are clear. No pleural effusion or pneumothorax. The cardiomediastinal contours are within normal limits. The visualized bones and soft tissues are without significant appreciable abnormality.  IMPRESSION: No radiographic evidence of acute cardiopulmonary process.   Original Report Authenticated By: Jearld Lesch, M.D.    Ct Abdomen Pelvis W Contrast  09/16/2012  *RADIOLOGY REPORT*  Clinical Data: Left upper quadrant abdominal pain.  Elevated white count.  History of pancreatitis.  CT ABDOMEN AND PELVIS WITH CONTRAST  Technique:  Multidetector CT imaging of the abdomen and pelvis was performed following the standard protocol during bolus administration of intravenous contrast.  Contrast: OMNIPAQUE IOHEXOL 300 MG/ML  SOLN  Comparison: CT abdomen pelvis without contrast 11/10/2011.  Findings: Mild dependent atelectasis is present bilaterally.  Heart size is normal.  No significant pleural or pericardial effusion is evident.  The liver and spleen are within normal limits.  The stomach, duodenum, and pancreas are unremarkable.  The common bile duct and gallbladder are within normal limits.  The adrenal glands are normal bilaterally.  The right kidney and ureter are within normal limits.  There is a striated pattern of the left kidney with patchy areas of decreased enhancement compatible with pyelonephritis. This is predominately at the upper pole.  No discrete abscess is evident.  There is stranding about the upper pole of the left kidney.  The proximal ureter is slightly dilated.  No distal obstruction is evident.  The rectosigmoid colon is within normal limits.  The remainder the colon is unremarkable.  The appendix is visualized and within normal limits.  The small bowel is normal.  Uterus and adnexa are within normal limits for age.  A small amount of free fluid  is likely physiologic.  Note is made of a tampon in the vagina.  Mild curvature of the thoracolumbar spine is convex to the left at L4 into the right at T12.  IMPRESSION:  1.  Striated appearance of the left kidney compatible with pyelonephritis, most prominent at the upper pole.  There is associated perinephric stranding without evidence for abscess. 2.  Slight dilation of the proximal left ureter is also compatible with pyelonephritis.  No obstructing lesions are evident. 3.  Minimal free fluid in the pelvis is likely physiologic.   Original Report Authenticated By: Marin Roberts, M.D.      1. Pyelonephritis   2. Hyponatremia   3. SIRS (systemic inflammatory response syndrome)       MDM  Pt with severe LUQ pain, h/o pancreatitis, no h/o ulcers, no pertinent EtOH or IDVA, no trauma.  Pt appears ill w/ infection is tachy and febrile.  Workup reveals WBC count of 30k, hyponatremia of 121, pt receiving fluids.  UA/CT identify pyelonephritis (UA not suggestive alone) as likely source of fever and infection.    D/W Dr. Waymon Amato for admission, added 2g ceftriaxone to antibiotic regimen. Pt stable and appears comfortable.   Hildenbrand Skene, MD 09/16/12 [phone]

## 2012-09-16 NOTE — ED Notes (Signed)
Pt c/o LUQ pain x 2 days, nausea, chills, urinary incontinence, HA.

## 2012-09-16 NOTE — Consult Note (Signed)
PULMONARY  / CRITICAL CARE MEDICINE  Name: Paige Fields MRN: [id number] DOB: [birth year]    ADMISSION DATE:  09/16/2012 CONSULTATION DATE:  4/24  REFERRING MD :  hongalgi PRIMARY SERVICE: [facility]-->PCCM  CHIEF COMPLAINT:  Septic shock  BRIEF PATIENT DESCRIPTION:  15 yowf admitted on 4/24 w/ septic shock in setting of left pyelonephritis   SIGNIFICANT EVENTS / STUDIES:  CT abd 4/24: 1. Striated appearance of the left kidney compatible with pyelonephritis, most prominent at the upper pole. There is associated perinephric stranding without evidence for abscess.2. Slight dilation of the proximal left ureter is also compatible with pyelonephritis. No obstructing lesions are evident.3. Minimal free fluid in the pelvis is likely physiologic  LINES / TUBES: Right IJ CVL 4/24>>>  CULTURES: BCX2 4/24>>> UC 4/24>>>  ANTIBIOTICS: Rocephin 4/24 >>>  HISTORY OF PRESENT ILLNESS:   32 y.o. female with PMH of renal calculi, diverticulosis, pancreatitis presented to [facility] ED on 09/16/12 with complaints of left-sided abdominal pain and fevers. She first got sick approximately 10 days ago when she developed right-sided abdominal pain and high fevers (104F). She attributed this to acute pancreatitis and stayed home, rested in bed and drank lots of fluids. Her symptom lasted for approximately a week and then gradually resolved. Her appetite never really returned. She ate regular food for a couple of days. She did not seek medical attention. Since 2-3 days, she started experiencing left sided abdominal pain  8/10 at its worst, made worse with movement, associated with urinary frequency but no dysuria, associated with fevers but did not check degree temperature, nausea but no vomiting, continued decreased appetite. Urine pregnancy test negative, CT abdomen reported with findings compatible for left sided pyelonephritis without evidence for abscess or obstructive lesions and chest x-ray negative  for acute processes. Patient has received 1 L of IV fluid and is being bolused with a second liter. She has received a dose of by mouth Cipro and is getting a dose of IV Rocephin. Originally admitted to hospital service. Had persistent hypotension in spite of IVFs. PCCM asked to see.     PAST MEDICAL HISTORY :  Past Medical History  Diagnosis Date  . Renal disorder     renal calculi  . Diverticulosis   . Pancreatitis    History reviewed. No pertinent past surgical history. Prior to Admission medications   Medication Sig Start Date End Date Taking? Authorizing Provider  acetaminophen (TYLENOL) 325 MG tablet Take 650 mg by mouth every 6 (six) hours as needed. For pain   Yes Historical Provider, MD  busPIRone (BUSPAR) 10 MG tablet Take 20 mg by mouth 2 (two) times daily.    Yes Historical Provider, MD  escitalopram (LEXAPRO) 20 MG tablet Take 30 mg by mouth daily.   Yes Historical Provider, MD  ondansetron (ZOFRAN) 8 MG tablet Take 1 tablet (8 mg total) by mouth every 4 (four) hours as needed for nausea. 04/01/12  Yes Donnetta Hutching, MD   Allergies  Allergen Reactions  . Sulfa Antibiotics Nausea And Vomiting    FAMILY HISTORY:  Family History  Problem Relation Age of Onset  . Diabetes Mother   . Heart disease Mother    SOCIAL HISTORY:  reports that she has quit smoking. She does not have any smokeless tobacco history on file. She reports that she uses illicit drugs (Marijuana). She reports that she does not drink alcohol.  REVIEW OF SYSTEMS bolds are positive :   Review of Systems  Constitutional: No weight  loss, gain, night sweats, Fevers, chills, fatigue .  HEENT: No headaches, visual changes, Difficulty swallowing, Tooth/dental problems, or Sore throat,  No sneezing, itching, ear ache, nasal congestion, post nasal drip, no visual complaints CV: No chest pain, Orthopnea, PND, swelling in lower extremities, dizziness, palpitations, syncope.  GI No heartburn, indigestion, abdominal  pain, nausea, vomiting, diarrhea, change in bowel habits, loss of appetite, bloody stools.  Resp: No cough, No coughing up of blood. No change in color of mucus. No wheezing.  Skin: no rash or itching or icterus GU: no dysuria, change in color of urine, no urgency or frequency. No flank pain, no hematuria  MS: No joint pain or swelling. No decreased range of motion  Psych: No change in mood or affect. No depression or anxiety.  Neuro: no difficulty with speech, weakness, numbness, ataxia    SUBJECTIVE:   VITAL SIGNS: Temp:  [97.6 F (36.4 C)-100.7 F (38.2 C)] 97.7 F (36.5 C) (04/24 1100) Pulse Rate:  [60-112] 60 (04/24 1100) Resp:  [13-21] 13 (04/24 1100) BP: (74-98)/(46-61) 75/46 mmHg (04/24 1100) SpO2:  [97 %-100 %] 97 % (04/24 1100) Weight:  [54.432 kg (120 lb)-57.9 kg (127 lb 10.3 oz)] 57.9 kg (127 lb 10.3 oz) (04/24 1100) HEMODYNAMICS:   VENTILATOR SETTINGS:   INTAKE / OUTPUT: Intake/Output     04/23 0701 - 04/24 0700 04/24 0701 - 04/25 0700   P.O.  240   IV Piggyback  1000   Total Intake(mL/kg)  1240 (21.4)   Net   +1240        Urine Occurrence  1 x     PHYSICAL EXAMINATION: General:  32 year old female, no acute distress.  Neuro:  Oriented X3, no focal def  HEENT:  , no JVD  Cardiovascular:  rrr Lungs:  Clear  Abdomen:  LLQ pain to palp/ left flank pain  Musculoskeletal:  Intact  Skin:  Intact   LABS:  Recent Labs Lab 09/16/12 0425  HGB 11.4*  WBC 30.0*  PLT 294  NA 121*  K 3.5  CL 85*  CO2 24  GLUCOSE 177*  BUN 9  CREATININE 0.78  CALCIUM 9.0  AST 17  ALT 14  ALKPHOS 78  BILITOT 1.0  PROT 6.8  ALBUMIN 3.0*  TROPONINI <0.30  PROCALCITON 5.47   No results found for this basename: GLUCAP,  in the last 168 hours  CXR: no acute issue, right IJ CVL in good position   ASSESSMENT / PLAN:  PULMONARY A:No acute issue P:   Trend pulse ox   CARDIOVASCULAR A: septic shock P:  EGDT protocol See ID section  RENAL A:  Left   Pyelonephritis  Mild L hydroureter by CT abd Hyponatremia: likely hypovolemia related  She is at risk for renal injury in setting of hypotension   P: Strict I&O MAP goal >65 Trend CMP Renal US to R/O obstruction  GASTROINTESTINAL A:  H/o chronic pancreatitis: lipase nml.  P:   Cl liquid diet  HEMATOLOGIC A:  Anemia: appears chronic. No evidence of bleeding  P:  [place] heparin  Trend CBC   INFECTIOUS A:  Septic shock, urinary tract source  P:   See dashboard & CV section   ENDOCRINE A:  No issue  P:   Trend CBC   NEUROLOGIC A:  No issue  P:   Supportive care  TODAY'S SUMMARY:  Admitted for septic shock UT source. Failed fluid challenge so will start EGDT protocol   I have personally obtained a  history, examined the patient, evaluated laboratory and imaging results, formulated the assessment and plan and placed orders.   Billy Fischer, MD ; [facility] [phone].  After 5:30 PM or weekends, call [phone]   Pulmonary and Critical Care Medicine [facility] Pager: [phone]  09/16/2012, 12:24 PM

## 2012-09-16 NOTE — H&P (Addendum)
[facility] History and Physical  Paige Fields:[id number] DOB: 28-Apr-1981 DOA: 09/16/2012  Referring physician: Dr. Nary Skene, EDP PCP: Default, Provider, MD  Specialists:  None  Chief Complaint: Abdominal pain & fever.  HPI: Paige Fields is a 32 y.o. female with PMH of renal calculi, diverticulosis, pancreatitis presented to [facility] ED on 09/16/12 with complaints of left-sided abdominal pain and fevers. She first got sick approximately 10 days ago when she developed right-sided abdominal pain and high fevers (104F). She attributed this to acute pancreatitis and stayed home, rested in bed and drank lots of fluids. Her symptom lasted for approximately a week and then gradually resolved. Her appetite never really returned. She ate regular food for a couple of days. She did not seek medical attention. Since 2-3 days, she started experiencing left sided abdominal pain under the rib cage, gradual onset, sharp, intermittent, 8/10 at its worst, made worse with movement, associated with urinary frequency but no dysuria, associated with fevers but did not check degree temperature, nausea but no vomiting, continued decreased appetite. She had mild headache. No chest pain. Had mild dyspnea associated with abdominal pain which has resolved since the pain has improved in the ED. No diarrhea. Patient is sexually active/protected sex. No vaginal discharge. Denies prior episodes of urinary tract infections. No insect bites or sickly contacts. In the ED, MAXIMUM TEMPERATURE 100.25F, initially blood pressure was 98/61 but subsequently dropped to 76/48 mmHg, pulse 112, WBC 30, hemoglobin 11.4, sodium 121, chloride 85, urine pregnancy test negative, CT abdomen reported with findings compatible for left sided pyelonephritis without evidence for abscess or obstructive lesions and chest x-ray negative for acute processes. Patient has received 1 L of IV fluid and is being bolused with a second  liter. She has received a dose of by mouth Cipro and is getting a dose of IV Rocephin. Hospitalist admission requested.   Review of Systems: All systems reviewed and apart from history of presenting illness, are negative   Past Medical History  Diagnosis Date  . Renal disorder     renal calculi  . Diverticulosis   . Pancreatitis    History reviewed. No pertinent past surgical history. Social History:  reports that she has quit smoking. She does not have any smokeless tobacco history on file. She reports that she uses illicit drugs (Marijuana). She reports that she does not drink alcohol. Single. She shares a room with a roommate. Independent of activities of daily living. Quit smoking approximately one month ago. Quit drinking alcohol during the last episode of pancreatitis in November-December of 2013. Last smoked marijuana approximately 2 weeks ago.  Allergies  Allergen Reactions  . Sulfa Antibiotics Nausea And Vomiting    Family History  Problem Relation Age of Onset  . Diabetes Mother   . Heart disease Mother     Prior to Admission medications   Medication Sig Start Date End Date Taking? Authorizing Provider  acetaminophen (TYLENOL) 325 MG tablet Take 650 mg by mouth every 6 (six) hours as needed. For pain   Yes Historical Provider, MD  busPIRone (BUSPAR) 10 MG tablet Take 20 mg by mouth 2 (two) times daily.    Yes Historical Provider, MD  citalopram (CELEXA) 20 MG tablet Take 30 mg by mouth daily.    Yes Historical Provider, MD  ondansetron (ZOFRAN) 8 MG tablet Take 1 tablet (8 mg total) by mouth every 4 (four) hours as needed for nausea. 04/01/12  Yes Donnetta Hutching, MD   Physical  Exam: Filed Vitals:   09/16/12 0410 09/16/12 0919  BP: 98/61 76/48  Pulse: 112 66  Temp: 100.7 F (38.2 C) 97.6 F (36.4 C)  TempSrc: Oral Oral  Resp: 21 17  Height: 5\' 5"  (1.651 m)   Weight: 54.432 kg (120 lb)   SpO2: 100% 98%     General exam: Moderately built and nourished female  patient, lying comfortably supine on the gurney in no obvious distress. Does not look toxic clinically.  Head, eyes and ENT: Nontraumatic and normocephalic. Pupils equally reacting to light and accommodation. Oral mucosa dry. Multiple piercing of lips and nose.  Neck: Supple. No JVD, carotid bruit or thyromegaly.  Lymphatics: No lymphadenopathy.  Respiratory system: Clear to auscultation. No increased work of breathing.  Cardiovascular system: S1 and S2 heard, RRR. No JVD, murmurs, gallops, clicks or pedal edema.  Gastrointestinal system: Abdomen is nondistended, soft. Mild tenderness in the left middle and upper quadrant but without rigidity, guarding or rebound. No renal angle tenderness. Normal bowel sounds heard. No organomegaly or masses appreciated.  Central nervous system: Alert and oriented. No focal neurological deficits.  Extremities: Symmetric 5 x 5 power. Peripheral pulses symmetrically felt.  Skin: No rashes or acute findings. Multiple tattoos.  Musculoskeletal system: Negative exam.  Psychiatry: Pleasant and cooperative.   Labs on Admission:  Basic Metabolic Panel:  Recent Labs Lab 09/16/12 0425  NA 121*  K 3.5  CL 85*  CO2 24  GLUCOSE 177*  BUN 9  CREATININE 0.78  CALCIUM 9.0   Liver Function Tests:  Recent Labs Lab 09/16/12 0425  AST 17  ALT 14  ALKPHOS 78  BILITOT 1.0  PROT 6.8  ALBUMIN 3.0*    Recent Labs Lab 09/16/12 0425  LIPASE 19   No results found for this basename: AMMONIA,  in the last 168 hours CBC:  Recent Labs Lab 09/16/12 0425  WBC 30.0*  NEUTROABS 27.0*  HGB 11.4*  HCT 32.4*  MCV 89.5  PLT 294   Cardiac Enzymes:  Recent Labs Lab 09/16/12 0425  TROPONINI <0.30    BNP (last 3 results) No results found for this basename: PROBNP,  in the last 8760 hours CBG: No results found for this basename: GLUCAP,  in the last 168 hours  Radiological Exams on Admission: Dg Chest 2 View  09/16/2012  *RADIOLOGY REPORT*   Clinical Data: Left upper quadrant pain  CHEST - 2 VIEW  Comparison: 11/10/2011  Findings: Lungs are clear. No pleural effusion or pneumothorax. The cardiomediastinal contours are within normal limits. The visualized bones and soft tissues are without significant appreciable abnormality.  IMPRESSION: No radiographic evidence of acute cardiopulmonary process.   Original Report Authenticated By: Jearld Lesch, M.D.    Ct Abdomen Pelvis W Contrast  09/16/2012  *RADIOLOGY REPORT*  Clinical Data: Left upper quadrant abdominal pain.  Elevated white count.  History of pancreatitis.  CT ABDOMEN AND PELVIS WITH CONTRAST  Technique:  Multidetector CT imaging of the abdomen and pelvis was performed following the standard protocol during bolus administration of intravenous contrast.  Contrast: OMNIPAQUE IOHEXOL 300 MG/ML  SOLN  Comparison: CT abdomen pelvis without contrast 11/10/2011.  Findings: Mild dependent atelectasis is present bilaterally.  Heart size is normal.  No significant pleural or pericardial effusion is evident.  The liver and spleen are within normal limits.  The stomach, duodenum, and pancreas are unremarkable.  The common bile duct and gallbladder are within normal limits.  The adrenal glands are normal bilaterally.  The right  kidney and ureter are within normal limits.  There is a striated pattern of the left kidney with patchy areas of decreased enhancement compatible with pyelonephritis. This is predominately at the upper pole.  No discrete abscess is evident.  There is stranding about the upper pole of the left kidney.  The proximal ureter is slightly dilated.  No distal obstruction is evident.  The rectosigmoid colon is within normal limits.  The remainder the colon is unremarkable.  The appendix is visualized and within normal limits.  The small bowel is normal.  Uterus and adnexa are within normal limits for age.  A small amount of free fluid is likely physiologic.  Note is made of a tampon  in the vagina.  Mild curvature of the thoracolumbar spine is convex to the left at L4 into the right at T12.  IMPRESSION:  1.  Striated appearance of the left kidney compatible with pyelonephritis, most prominent at the upper pole.  There is associated perinephric stranding without evidence for abscess. 2.  Slight dilation of the proximal left ureter is also compatible with pyelonephritis.  No obstructing lesions are evident. 3.  Minimal free fluid in the pelvis is likely physiologic.   Original Report Authenticated By: Marin Roberts, M.D.       Assessment/Plan Principal Problem:   Sepsis Active Problems:   Acute pyelonephritis   Dehydration with hyponatremia   Hypotension   Leukocytosis   Anemia   1. Sepsis, present on admission/hypotension/leukocytosis: Likely secondary to acute pyelonephritis. Admit to step down. Follow Blood and urine cultures. Aggressive IV fluid hydration. Continue IV Rocephin and monitor closely. Monitor pro calcitonin. 2. Acute pyelonephritis: Continue IV Rocephin. Follow urine cultures.  3. Dehydration with hyponatremia: Secondary to poor oral intake and sepsis. The episode of abdominal pain and fever that she had last week may also have been secondary to acute pyelonephritis. Hyponatremia may be due to a combination of dehydration and Celexa. IV normal saline and follow BMP closely. 4. Leukocytosis: Secondary to sepsis. Follow daily CBCs. 5. Anemia: Possibly chronic. Follow CBC in a.m. 6. History of depression: Stable. No suicidal or homicidal ideations. Continue home medications. 7. Substance abuse/marijuana: Cessation counseled.     Code Status: Full  Family Communication: Discussed directly with patient.  Disposition Plan: Inpatient admission. Home when medically stable.   Time spent: 60 minutes.  [facility] Pager [phone]  If 7PM-7AM, please contact night-coverage [email or web address] Password [facility] 09/16/2012, 9:58  AM   Addendum Despite IV fluid boluses, patient remained hypotensive/shock with SBP in 60s. Critical care team consulted and have taken over primary care.  Geneva Barrero 3:27 PM 09/16/2012

## 2012-09-16 NOTE — ED Notes (Signed)
Pt BP 76/48 manual. Hospitalist Hongagli made aware. Verbal order for NS 0.9% 1L bolus.

## 2012-09-17 DIAGNOSIS — N12 Tubulo-interstitial nephritis, not specified as acute or chronic: Secondary | ICD-10-CM

## 2012-09-17 LAB — CBC
Hemoglobin: 9.8 g/dL — ABNORMAL LOW (ref 12.0–15.0)
MCHC: 34.3 g/dL (ref 30.0–36.0)
RBC: 3.12 MIL/uL — ABNORMAL LOW (ref 3.87–5.11)

## 2012-09-17 LAB — BASIC METABOLIC PANEL
GFR calc Af Amer: >90 mL/min (ref >90)
GFR calc non Af Amer: >90 mL/min (ref >90)
Potassium: 3.9 mEq/L (ref 3.5–5.1)
Sodium: 137 mEq/L (ref 135–145)

## 2012-09-17 MED ORDER — ESCITALOPRAM OXALATE 20 MG PO TABS
30.0000 mg | ORAL_TABLET | Freq: Every day | ORAL | Status: DC
  Administered 2012-09-18 (×2): 30 mg via ORAL
  Filled 2012-09-17 (×3): qty 1

## 2012-09-17 MED ORDER — SODIUM CHLORIDE 0.9 % IV BOLUS (SEPSIS)
1000.0000 mL | Freq: Once | INTRAVENOUS | Status: AC
  Administered 2012-09-17: 1000 mL via INTRAVENOUS

## 2012-09-17 MED ORDER — ESCITALOPRAM OXALATE 20 MG PO TABS: 30.0000 mg | ORAL_TABLET | Freq: Every day | ORAL | Status: DC

## 2012-09-17 NOTE — Progress Notes (Signed)
eLink Physician-Brief Progress Note Patient Name: Paige Fields DOB: 06/16/1980 MRN: [id number]  Date of Service  09/17/2012   HPI/Events of Note  Continued episodes of hypotension with current BP of 85/51 with CVP of 5.  Has 3700cc/3800 UOP.   eICU Interventions  Plan: Order for 1 liter of NS for BP support   Intervention Category Intermediate Interventions: Hypotension - evaluation and management  DETERDING,ELIZABETH 09/17/2012, 6:53 AM

## 2012-09-17 NOTE — Progress Notes (Signed)
PULMONARY  / CRITICAL CARE MEDICINE  Name: Paige Fields MRN: [id number] DOB: [birth year]    ADMISSION DATE:  09/16/2012 CONSULTATION DATE:  4/24  REFERRING MD :  hongalgi PRIMARY SERVICE: [facility]-->PCCM  CHIEF COMPLAINT:  Septic shock  BRIEF PATIENT DESCRIPTION:  81 yowf admitted on 4/24 w/ septic shock in setting of left pyelonephritis   SIGNIFICANT EVENTS / STUDIES:  CT abd 4/24: 1. Striated appearance of the left kidney compatible with pyelonephritis, most prominent at the upper pole. There is associated perinephric stranding without evidence for abscess.2. Slight dilation of the proximal left ureter is also compatible with pyelonephritis. No obstructing lesions are evident.3. Minimal free fluid in the pelvis is likely physiologic  LINES / TUBES: Right IJ CVL 4/24>>>  CULTURES: BCX2 4/24>>> UC 4/24: ecoli>>>  ANTIBIOTICS: Rocephin 4/24 >>>   SUBJECTIVE:  Feels better VITAL SIGNS: Temp:  [97.6 F (36.4 C)-102.9 F (39.4 C)] 97.9 F (36.6 C) (04/25 0800) Pulse Rate:  [51-133] 88 (04/25 1005) Resp:  [13-33] 25 (04/25 1005) BP: (68-135)/(41-108) 100/67 mmHg (04/25 1005) SpO2:  [88 %-100 %] 100 % (04/25 1005) Weight:  [57.9 kg (127 lb 10.3 oz)] 57.9 kg (127 lb 10.3 oz) (04/24 1100) HEMODYNAMICS: CVP:  [5 mmHg-10 mmHg] 7 mmHg    INTAKE / OUTPUT: Intake/Output     04/24 0701 - 04/25 0700 04/25 0701 - 04/26 0700   P.O. 2400 240   I.V. (mL/kg) 3282.4 (56.7) 500 (8.6)   IV Piggyback 4000 1050   Total Intake(mL/kg) 9682.4 (167.2) 1790 (30.9)   Urine (mL/kg/hr) 5475 (3.9) 1400 (7.3)   Total Output 5475 1400   Net +4207.4 +390        Urine Occurrence 1 x      PHYSICAL EXAMINATION: General:  32 year old female, no acute distress.  Neuro:  Oriented X3, no focal def  HEENT:  [place], no JVD  Cardiovascular:  rrr Lungs:  Clear  Abdomen:  LLQ pain to palp/ left flank pain -->resolved  Musculoskeletal:  Intact  Skin:  Intact   LABS:  Recent Labs Lab 09/16/12 0425  09/16/12 1300 09/17/12 0335  HGB 11.4*  --  9.8*  WBC 30.0*  --  14.7*  PLT 294  --  248  NA 121* 137 137  K 3.5 3.9 3.9  CL 85* 107 108  CO2 24 22 20   GLUCOSE 177* 123* 110*  BUN 9 7 3*  CREATININE 0.78 0.57 0.58  CALCIUM 9.0 7.5* 7.6*  AST 17 13  --   ALT 14 10  --   ALKPHOS 78 57  --   BILITOT 1.0 0.5  --   PROT 6.8 5.3*  --   ALBUMIN 3.0* 2.3*  --   LATICACIDVEN  --  0.7  --   TROPONINI <0.30  --   --   PROCALCITON 5.47  --   --    No results found for this basename: GLUCAP,  in the last 168 hours  CXR: no acute issue, right IJ CVL in good position   ASSESSMENT / PLAN:  PULMONARY A:No acute issue P:   Trend pulse ox   CARDIOVASCULAR A: septic shock-->resolved  P:  Decrease IVFs D/CV pressors  RENAL A:  Left  Pyelonephritis  Renal US was neg P: See ID section   GASTROINTESTINAL A:  H/o chronic pancreatitis: lipase nml.  P:   Cl liquid diet-->advance as tol   HEMATOLOGIC A:  Anemia: appears chronic. No evidence of bleeding  P:  [place] heparin  Trend CBC   INFECTIOUS A:  Septic shock, urinary tract source (E-coli, sensitivities pending).  P:   See dashboard & CV section-->shock resolved   ENDOCRINE A:  Relative adrenal insuff.  Random cort was 7.2.  P:   She will need endocrine follow-up at some point, have recommended Napoleon Form to her   NEUROLOGIC A:  No issue  P:   Supportive care  TODAY'S SUMMARY:  Much improved. OK to move out of ICU. TRH to resume primary duties as of 4/26 AM and PCCM will sign off. Might be ready for discharge to home in next day or two   Billy Fischer, MD ; [facility] [phone].  After 5:30 PM or weekends, call [phone]  09/17/2012, 10:21 AM

## 2012-09-18 DIAGNOSIS — R6520 Severe sepsis without septic shock: Secondary | ICD-10-CM

## 2012-09-18 DIAGNOSIS — E2740 Unspecified adrenocortical insufficiency: Secondary | ICD-10-CM | POA: Diagnosis present

## 2012-09-18 DIAGNOSIS — E2749 Other adrenocortical insufficiency: Secondary | ICD-10-CM

## 2012-09-18 DIAGNOSIS — A419 Sepsis, unspecified organism: Secondary | ICD-10-CM

## 2012-09-18 LAB — CBC
HCT: 28.9 % — ABNORMAL LOW (ref 36.0–46.0)
Hemoglobin: 9.8 g/dL — ABNORMAL LOW (ref 12.0–15.0)
RDW: 14.4 % (ref 11.5–15.5)
WBC: 10.1 10*3/uL (ref 4.0–10.5)

## 2012-09-18 LAB — URINE CULTURE: Colony Count: >=100000

## 2012-09-18 LAB — PROCALCITONIN: Procalcitonin: 2.1 ng/mL

## 2012-09-18 LAB — BASIC METABOLIC PANEL
BUN: 3 mg/dL — ABNORMAL LOW (ref 6–23)
Chloride: 105 mEq/L (ref 96–112)
GFR calc Af Amer: >90 mL/min (ref >90)
Potassium: 4.3 mEq/L (ref 3.5–5.1)

## 2012-09-18 MED ORDER — BUSPIRONE HCL 10 MG PO TABS: 20.0000 mg | ORAL_TABLET | Freq: Two times a day (BID) | ORAL | Status: DC

## 2012-09-18 MED ORDER — ESCITALOPRAM OXALATE 20 MG PO TABS: 30.0000 mg | ORAL_TABLET | Freq: Every day | ORAL | Status: DC

## 2012-09-18 MED ORDER — CIPROFLOXACIN HCL 500 MG PO TABS: 500.0000 mg | ORAL_TABLET | Freq: Two times a day (BID) | ORAL | Status: AC

## 2012-09-18 NOTE — Progress Notes (Signed)
09/18/12 1618  Reviewed discharged with patient. Patient verbalized understanding of discharge and will be following up with Endo MD and a new PCP. Prescriptions given to patient.

## 2012-09-18 NOTE — Discharge Summary (Signed)
Physician Discharge Summary  [name]:[id number] DOB: 1981/02/17 DOA: 09/16/2012  PCP: Default, Provider, MD  Admit date: 09/16/2012 Discharge date: 09/18/2012  Time spent: 60 minutes  Recommendations for Outpatient Follow-up:  1. patient is to pick a PCP and followup in 1 week. A followup basic metabolic profile and a CBC will need to be obtained to followup on patient's electrolytes renal function and hemoglobin.  2. Patient is to followup with Dr. Sharl Ma, endocrinology as outpatient in 1 to 2 weeks for further evaluation of her low random cortisol level/probable relative adrenal insufficiency.  Discharge Diagnoses:  Principal Problem:   Severe sepsis with septic shock Active Problems:   Acute pyelonephritis   Sepsis   Dehydration with hyponatremia   Hypotension   Leukocytosis   Anemia   Hydroureter on left   Low serum cortisol level   Discharge Condition: Stable and improved  Diet recommendation: Regular  Filed Weights   09/16/12 0410 09/16/12 1100  Weight: 54.432 kg (120 lb) 57.9 kg (127 lb 10.3 oz)    History of present illness:  Paige Fields is a 32 y.o. female with PMH of renal calculi, diverticulosis, pancreatitis presented to [facility] ED on 09/16/12 with complaints of left-sided abdominal pain and fevers. She first got sick approximately 10 days ago when she developed right-sided abdominal pain and high fevers (104F). She attributed this to acute pancreatitis and stayed home, rested in bed and drank lots of fluids. Her symptom lasted for approximately a week and then gradually resolved. Her appetite never really returned. She ate regular food for a couple of days. She did not seek medical attention. Since 2-3 days, she started experiencing left sided abdominal pain under the rib cage, gradual onset, sharp, intermittent, 8/10 at its worst, made worse with movement, associated with urinary frequency but no dysuria, associated with fevers but did not  check degree temperature, nausea but no vomiting, continued decreased appetite. She had mild headache. No chest pain. Had mild dyspnea associated with abdominal pain which has resolved since the pain has improved in the ED. No diarrhea. Patient is sexually active/protected sex. No vaginal discharge. Denies prior episodes of urinary tract infections. No insect bites or sickly contacts. In the ED, MAXIMUM TEMPERATURE 100.58F, initially blood pressure was 98/61 but subsequently dropped to 76/48 mmHg, pulse 112, WBC 30, hemoglobin 11.4, sodium 121, chloride 85, urine pregnancy test negative, CT abdomen reported with findings compatible for left sided pyelonephritis without evidence for abscess or obstructive lesions and chest x-ray negative for acute processes. Patient has received 1 L of IV fluid and is being bolused with a second liter. She has received a dose of by mouth Cipro and is getting a dose of IV Rocephin. Hospitalist admission requested   Hospital Course:  #1 septic shock Patient was admitted with left CVA tenderness, abdominal pain, fevers of 104, hypotensive with systolic blood pressures in the 70s and a white count of 30,000. Urinalysis which was done was consistent with a UTI. CT of the abdomen and pelvis which was done secondary to patient's abdominal pain had findings of the left kidney compatible with pyelonephritis prominently in the upper pole with associated perinephric stranding without evidence of abscess. It also showed slight dilatation of the proximal left ureter compatible with pyelonephritis and no obstructing lesions. It was felt patient's septic shock was secondary to acute pyelonephritis and Escherichia coli UTI. Patient was hydrated with IV fluids placed empirically on IV Rocephin and initially admitted to the step down  unit. Patient was given several boluses of IV fluid however remained hypotensive and a such a critical care consultation was obtained. Patient was seen in  consultation by Dr. Darrol Angel of 09/16/2012. A right IJ CVL was placed patient was maintained on IV fluids IV antibiotics blood cultures were obtained with no growth to date. Urine cultures also obtained which came back with a > 100,000 Escherichia coli. Patient was placed on pressors with improvementIn her blood pressure. Patient's leukocytosis trended down her blood pressure improved. Random cortisol level which was also obtained came back at 7.2 and drawn at 1 PM in the afternoon. Patient improved clinically pressors were discontinued as well as IV fluids. Patient's blood pressure remained stable with systolic greater than 100. Patient was subsequently transferred from the step down unit to the regular floor. Patient remained afebrile and leukocytosis improved such that by day of discharge patient's white count was 10.1. Patient will be discharged home on 12 more days of oral ciprofloxacin to complete a two-week course of antibiotic therapy. She'll be discharged in stable and improved condition.  #2 acute pyelonephritis/Escherichia coli UTI Patient was admitted with acute pyelonephritis by history and physical and CT scan findings. Patient was started empirically on IV Rocephin as well as IV fluids and pain management. Urine cultures came back with greater than 100,000 Escherichia coli which was pansensitive. Patient's left CVA tenderness improved and had resolved by day of discharge. Patient remained afebrile. CT of the abdomen and pelvis which was done was negative for any abscesses. Patient's leukocytosis trended down and was down to 10.1 by day of discharge. Patient be discharged home on 12 more days of oral ciprofloxacin to complete a two-week course of antibiotic therapy.  #3 anemia On admission patient was noted to have an anemia which was felt to be chronic in nature likely dilutional and likely secondary to iron deficiency anemia in a menstruating female. Patient had no source or evidence of acute GI  bleed. Patient's hemoglobin remained stable. Patient be discharged in stable condition.  #4 low cortisol level/questionable relative adrenal insufficiency On admission due to patient's hypotension and septic shock a random cortisol level was drawn at 1 PM which came back at 7.2. Patient's blood pressure improved on pressors and IV fluids. His recommended that patient followup as outpatient with endocrinology for further evaluation of her low cortisol levels and questionable relative adrenal insufficiency.  The rest of patient's chronic medical issues remain stable throughout the hospitalization and patient be discharged in stable and improved condition.  Procedures:  CT of the abdomen and pelvis 09/16/2012  Right IJ CVL 09/16/2012  Consultations:  Critical care medicine Dr. Sung Amabile 09/16/2012  Discharge Exam: Filed Vitals:   09/17/12 1551 09/17/12 2156 09/18/12 0208 09/18/12 0539  BP: 114/74 90/53 100/66 112/83  Pulse: 90 79 79 86  Temp: 99.2 F (37.3 C) 98.7 F (37.1 C) 98.5 F (36.9 C) 98.7 F (37.1 C)  TempSrc: Oral Oral Oral Oral  Resp: 21 18 16 18   Height:      Weight:      SpO2: 100% 100% 98% 96%    General: NAD Cardiovascular: RRR Respiratory: CTAB Abdomen: Soft, nontender, nondistended, positive bowel sounds. No CVA tenderness.  Discharge Instructions      Discharge Orders   Future Orders Complete By Expires     Diet general  As directed     Discharge instructions  As directed     Comments:      Pick a PCP and follow up  in 1 week. Follow up with Dr Sharl Ma, of endocrinology in 1-2 weeks    Increase activity slowly  As directed         Medication List    TAKE these medications       acetaminophen 325 MG tablet  Commonly known as:  TYLENOL  Take 650 mg by mouth every 6 (six) hours as needed. For pain     busPIRone 10 MG tablet  Commonly known as:  BUSPAR  Take 2 tablets (20 mg total) by mouth 2 (two) times daily.     ciprofloxacin 500 MG tablet   Commonly known as:  CIPRO  Take 1 tablet (500 mg total) by mouth 2 (two) times daily. tAKE FOR 12 DAYS THEN STOP.     escitalopram 20 MG tablet  Commonly known as:  LEXAPRO  Take 1.5 tablets (30 mg total) by mouth daily.     ondansetron 8 MG tablet  Commonly known as:  ZOFRAN  Take 1 tablet (8 mg total) by mouth every 4 (four) hours as needed for nausea.       Follow-up Information   Follow up with KERR,JEFFREY, MD. Schedule an appointment as soon as possible for a visit in 1 week. (f/u in 1-2 weeks for eval of low cortisol levels)    Contact information:   [address] [facility] [place] [address] [phone]       Please follow up. (pt to pick PCP and f/u in 1 week)        The results of significant diagnostics from this hospitalization (including imaging, microbiology, ancillary and laboratory) are listed below for reference.    Significant Diagnostic Studies: Dg Chest 2 View  09/16/2012  *RADIOLOGY REPORT*  Clinical Data: Left upper quadrant pain  CHEST - 2 VIEW  Comparison: 11/10/2011  Findings: Lungs are clear. No pleural effusion or pneumothorax. The cardiomediastinal contours are within normal limits. The visualized bones and soft tissues are without significant appreciable abnormality.  IMPRESSION: No radiographic evidence of acute cardiopulmonary process.   Original Report Authenticated By: Jearld Lesch, M.D.    Ct Abdomen Pelvis W Contrast  09/16/2012  *RADIOLOGY REPORT*  Clinical Data: Left upper quadrant abdominal pain.  Elevated white count.  History of pancreatitis.  CT ABDOMEN AND PELVIS WITH CONTRAST  Technique:  Multidetector CT imaging of the abdomen and pelvis was performed following the standard protocol during bolus administration of intravenous contrast.  Contrast: OMNIPAQUE IOHEXOL 300 MG/ML  SOLN  Comparison: CT abdomen pelvis without contrast 11/10/2011.  Findings: Mild dependent atelectasis is present  bilaterally.  Heart size is normal.  No significant pleural or pericardial effusion is evident.  The liver and spleen are within normal limits.  The stomach, duodenum, and pancreas are unremarkable.  The common bile duct and gallbladder are within normal limits.  The adrenal glands are normal bilaterally.  The right kidney and ureter are within normal limits.  There is a striated pattern of the left kidney with patchy areas of decreased enhancement compatible with pyelonephritis. This is predominately at the upper pole.  No discrete abscess is evident.  There is stranding about the upper pole of the left kidney.  The proximal ureter is slightly dilated.  No distal obstruction is evident.  The rectosigmoid colon is within normal limits.  The remainder the colon is unremarkable.  The appendix is visualized and within normal limits.  The small bowel is normal.  Uterus and adnexa are within normal  limits for age.  A small amount of free fluid is likely physiologic.  Note is made of a tampon in the vagina.  Mild curvature of the thoracolumbar spine is convex to the left at L4 into the right at T12.  IMPRESSION:  1.  Striated appearance of the left kidney compatible with pyelonephritis, most prominent at the upper pole.  There is associated perinephric stranding without evidence for abscess. 2.  Slight dilation of the proximal left ureter is also compatible with pyelonephritis.  No obstructing lesions are evident. 3.  Minimal free fluid in the pelvis is likely physiologic.   Original Report Authenticated By: Marin Roberts, M.D.    US Renal  09/16/2012  *RADIOLOGY REPORT*  Clinical Data: Evaluate obstruction of left ureter  RENAL/URINARY TRACT ULTRASOUND COMPLETE  Comparison:  Prior CT abdomen/pelvis earlier today, 09/17/2011 at 07:01 a.m.  Findings:  Right Kidney:  Normal reniform contour without hydronephrosis, nephrolithiasis or solid renal mass.  The kidney measures 13.2 cm in length.  Left Kidney:  Normal  reniform contour without hydronephrosis, nephrolithiasis or solid renal mass.  The kidney measures 13 cm in length.  Bladder:  Unremarkable  IMPRESSION: Negative renal ultrasound.  No evidence of hydronephrosis.   Original Report Authenticated By: Malachy Moan, M.D.    Dg Chest Portable 1 View  09/16/2012  *RADIOLOGY REPORT*  Clinical Data: Sepsis.  Weakness.  PORTABLE CHEST - 1 VIEW  Comparison: 09/16/2012.  Findings: New right IJ central line is present with the tip in the mid-to-lower SVC, inferior to the level of the carina.  There is no airspace disease.  No effusion.  Cardiopericardial silhouette is within normal limits. No pneumothorax. Monitoring leads are projected over the chest.  Metallic object projects over the left chest, unchanged from prior exam, presumably external to the patient. Linear retrocardiac density compatible with atelectasis.  IMPRESSION: Uncomplicated interval placement of right IJ central line with the tip in the mid-to-lower SVC.  No pneumothorax.   Original Report Authenticated By: Andreas Newport, M.D.     Microbiology: Recent Results (from the past 240 hour(s))  URINE CULTURE     Status: None   Collection Time    09/16/12  4:27 AM      Result Value Range Status   Specimen Description URINE, CLEAN CATCH   Final   Special Requests NONE   Final   Culture  Setup Time 09/16/2012 08:33   Final   Colony Count >=100,000 COLONIES/ML   Final   Culture ESCHERICHIA COLI   Final   Report Status 09/18/2012 FINAL   Final   Organism ID, Bacteria ESCHERICHIA COLI   Final  CULTURE, BLOOD (ROUTINE X 2)     Status: None   Collection Time    09/16/12 10:10 AM      Result Value Range Status   Specimen Description BLOOD LEFT ANTECUBITAL   Final   Special Requests BOTTLES DRAWN AEROBIC AND ANAEROBIC   Final   Culture  Setup Time 09/16/2012 13:47   Final   Culture     Final   Value:        BLOOD CULTURE RECEIVED NO GROWTH TO DATE CULTURE WILL BE HELD FOR 5 DAYS BEFORE  ISSUING A FINAL NEGATIVE REPORT   Report Status PENDING   Incomplete  CULTURE, BLOOD (ROUTINE X 2)     Status: None   Collection Time    09/16/12 10:15 AM      Result Value Range Status   Specimen Description BLOOD LEFT  FOREARM   Final   Special Requests BOTTLES DRAWN AEROBIC ONLY   Final   Culture  Setup Time 09/16/2012 13:47   Final   Culture     Final   Value:        BLOOD CULTURE RECEIVED NO GROWTH TO DATE CULTURE WILL BE HELD FOR 5 DAYS BEFORE ISSUING A FINAL NEGATIVE REPORT   Report Status PENDING   Incomplete  MRSA PCR SCREENING     Status: None   Collection Time    09/16/12 10:57 AM      Result Value Range Status   MRSA by PCR NEGATIVE  NEGATIVE Final   Comment:            The GeneXpert MRSA Assay (FDA     approved for NASAL specimens     only), is one component of a     comprehensive MRSA colonization     surveillance program. It is not     intended to diagnose MRSA     infection nor to guide or     monitor treatment for     MRSA infections.     Labs: Basic Metabolic Panel:  Recent Labs Lab 09/16/12 0425 09/16/12 1300 09/17/12 0335 09/18/12 0544  NA 121* 137 137 137  K 3.5 3.9 3.9 4.3  CL 85* 107 108 105  CO2 24 22 20 25   GLUCOSE 177* 123* 110* 92  BUN 9 7 3* 3*  CREATININE 0.78 0.57 0.58 0.51  CALCIUM 9.0 7.5* 7.6* 8.6   Liver Function Tests:  Recent Labs Lab 09/16/12 0425 09/16/12 1300  AST 17 13  ALT 14 10  ALKPHOS 78 57  BILITOT 1.0 0.5  PROT 6.8 5.3*  ALBUMIN 3.0* 2.3*    Recent Labs Lab 09/16/12 0425  LIPASE 19   No results found for this basename: AMMONIA,  in the last 168 hours CBC:  Recent Labs Lab 09/16/12 0425 09/17/12 0335 09/18/12 0544  WBC 30.0* 14.7* 10.1  NEUTROABS 27.0*  --   --   HGB 11.4* 9.8* 9.8*  HCT 32.4* 28.6* 28.9*  MCV 89.5 91.7 90.9  PLT 294 248 264   Cardiac Enzymes:  Recent Labs Lab 09/16/12 0425  TROPONINI <0.30   BNP: BNP (last 3 results) No results found for this basename: PROBNP,   in the last 8760 hours CBG: No results found for this basename: GLUCAP,  in the last 168 hours     Signed:  Calin Ellery  [facility] 09/18/2012, 2:24 PM

## 2012-09-18 NOTE — Care Management (Signed)
CM spoke with patient concerning MD consult for PCP issues. Pt provided with information concerning [facility]. Pt instructed to [facility] to schedule an appointment or attend the walk-in session on T or Th at 7am. Pt states able to afford home meds.   Roxy Manns Theoplis Garciagarcia,RN,BSN [phone]

## 2012-09-22 LAB — CULTURE, BLOOD (ROUTINE X 2)

## 2012-11-01 ENCOUNTER — Emergency Department (INDEPENDENT_AMBULATORY_CARE_PROVIDER_SITE_OTHER)
Admission: EM | Admit: 2012-11-01 | Discharge: 2012-11-01 | Disposition: A | Discharge status: Home / Self Care | Payer: Self-pay | Source: Home / Self Care | Attending: Emergency Medicine | Admitting: Emergency Medicine

## 2012-11-01 ENCOUNTER — Encounter (HOSPITAL_COMMUNITY): Payer: Self-pay | Admitting: *Deleted

## 2012-11-01 DIAGNOSIS — T148XXA Other injury of unspecified body region, initial encounter: Secondary | ICD-10-CM

## 2012-11-01 HISTORY — DX: Unspecified adrenocortical insufficiency: E27.40

## 2012-11-01 MED ORDER — CYCLOBENZAPRINE HCL 10 MG PO TABS: 10.0000 mg | ORAL_TABLET | Freq: Two times a day (BID) | ORAL | Status: DC | PRN

## 2012-11-01 MED ORDER — NAPROXEN 500 MG PO TABS: 500.0000 mg | ORAL_TABLET | Freq: Two times a day (BID) | ORAL | Status: DC

## 2012-11-01 NOTE — ED Provider Notes (Signed)
Medical screening examination/treatment/procedure(s) were performed by non-physician practitioner and as supervising physician I was immediately available for consultation/collaboration.  Cacie Gaskins   Lilac Hoff, MD 11/01/12 1949 
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
## 2013-01-27 ENCOUNTER — Other Ambulatory Visit: Payer: Self-pay | Admitting: Family Medicine

## 2013-01-27 ENCOUNTER — Emergency Department (HOSPITAL_COMMUNITY): Payer: Medicaid Other

## 2013-01-27 ENCOUNTER — Emergency Department (HOSPITAL_COMMUNITY)
Admission: EM | Admit: 2013-01-27 | Discharge: 2013-01-27 | Disposition: A | Discharge status: Home / Self Care | Payer: Medicaid Other | Attending: Emergency Medicine | Admitting: Emergency Medicine

## 2013-01-27 ENCOUNTER — Encounter (HOSPITAL_COMMUNITY): Payer: Self-pay | Admitting: *Deleted

## 2013-01-27 DIAGNOSIS — Z87442 Personal history of urinary calculi: Secondary | ICD-10-CM | POA: Insufficient documentation

## 2013-01-27 DIAGNOSIS — Z87891 Personal history of nicotine dependence: Secondary | ICD-10-CM | POA: Insufficient documentation

## 2013-01-27 DIAGNOSIS — Z8639 Personal history of other endocrine, nutritional and metabolic disease: Secondary | ICD-10-CM | POA: Insufficient documentation

## 2013-01-27 DIAGNOSIS — R413 Other amnesia: Secondary | ICD-10-CM | POA: Insufficient documentation

## 2013-01-27 DIAGNOSIS — Z8719 Personal history of other diseases of the digestive system: Secondary | ICD-10-CM | POA: Insufficient documentation

## 2013-01-27 DIAGNOSIS — R52 Pain, unspecified: Secondary | ICD-10-CM

## 2013-01-27 DIAGNOSIS — Z3202 Encounter for pregnancy test, result negative: Secondary | ICD-10-CM | POA: Insufficient documentation

## 2013-01-27 DIAGNOSIS — R569 Unspecified convulsions: Secondary | ICD-10-CM | POA: Insufficient documentation

## 2013-01-27 DIAGNOSIS — Z862 Personal history of diseases of the blood and blood-forming organs and certain disorders involving the immune mechanism: Secondary | ICD-10-CM | POA: Insufficient documentation

## 2013-01-27 DIAGNOSIS — Z79899 Other long term (current) drug therapy: Secondary | ICD-10-CM | POA: Insufficient documentation

## 2013-01-27 LAB — POCT I-STAT, CHEM 8
BUN: 8 mg/dL (ref 6–23)
Chloride: 105 mEq/L (ref 96–112)
Creatinine, Ser: 1.1 mg/dL (ref 0.50–1.10)
Glucose, Bld: 120 mg/dL — ABNORMAL HIGH (ref 70–99)
HCT: 39 % (ref 36.0–46.0)
Potassium: 4.1 mEq/L (ref 3.5–5.1)
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
## 2013-01-27 NOTE — ED Notes (Signed)
Pt up out of bed and walked to the bathroom with standby assist.

## 2013-01-27 NOTE — ED Notes (Signed)
Per [facility] pt was at home with her boyfriend and he reports that she had a episode in which she "became rigid" and would not respond to verbal or physical stimuli.  He states that it lasted around 2 mins followed by a period of disorientation and slow responsiveness.  On EMS arrival pt was alert but slow to initially respond.  No distress noted and no further "seizure like activity noted during transport".

## 2013-02-01 ENCOUNTER — Ambulatory Visit (INDEPENDENT_AMBULATORY_CARE_PROVIDER_SITE_OTHER): Payer: Self-pay | Admitting: Neurology

## 2013-02-01 ENCOUNTER — Encounter: Payer: Self-pay | Admitting: Neurology

## 2013-02-01 VITALS — BP 105/71 | HR 74 | Ht 64.0 in | Wt 125.0 lb

## 2013-02-01 DIAGNOSIS — F419 Anxiety disorder, unspecified: Secondary | ICD-10-CM | POA: Insufficient documentation

## 2013-02-01 DIAGNOSIS — R569 Unspecified convulsions: Secondary | ICD-10-CM | POA: Insufficient documentation

## 2013-02-01 DIAGNOSIS — F411 Generalized anxiety disorder: Secondary | ICD-10-CM
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
## 2013-02-03 ENCOUNTER — Other Ambulatory Visit: Payer: Self-pay

## 2013-02-25 ENCOUNTER — Other Ambulatory Visit: Payer: Medicaid Other

## 2013-03-31 ENCOUNTER — Telehealth: Payer: Self-pay | Admitting: Neurology

## 2013-05-26 DIAGNOSIS — R569 Unspecified convulsions: Secondary | ICD-10-CM

## 2013-05-26 HISTORY — DX: Unspecified convulsions: R56.9

## 2013-09-19 ENCOUNTER — Emergency Department (HOSPITAL_COMMUNITY): Payer: Medicaid Other

## 2013-09-19 ENCOUNTER — Encounter (HOSPITAL_COMMUNITY): Payer: Self-pay | Admitting: Emergency Medicine

## 2013-09-19 ENCOUNTER — Emergency Department (HOSPITAL_COMMUNITY)
Admission: EM | Admit: 2013-09-19 | Discharge: 2013-09-19 | Disposition: A | Discharge status: Home / Self Care | Payer: Medicaid Other | Attending: Emergency Medicine | Admitting: Emergency Medicine

## 2013-09-19 DIAGNOSIS — Z8669 Personal history of other diseases of the nervous system and sense organs: Secondary | ICD-10-CM | POA: Insufficient documentation

## 2013-09-19 DIAGNOSIS — E860 Dehydration: Secondary | ICD-10-CM | POA: Insufficient documentation

## 2013-09-19 DIAGNOSIS — R682 Dry mouth, unspecified: Secondary | ICD-10-CM

## 2013-09-19 DIAGNOSIS — Z3202 Encounter for pregnancy test, result negative: Secondary | ICD-10-CM | POA: Insufficient documentation

## 2013-09-19 DIAGNOSIS — R350 Frequency of micturition: Secondary | ICD-10-CM | POA: Insufficient documentation

## 2013-09-19 DIAGNOSIS — Z87442 Personal history of urinary calculi: Secondary | ICD-10-CM | POA: Insufficient documentation

## 2013-09-19 DIAGNOSIS — R631 Polydipsia: Secondary | ICD-10-CM | POA: Insufficient documentation

## 2013-09-19 DIAGNOSIS — K5900 Constipation, unspecified: Secondary | ICD-10-CM | POA: Insufficient documentation

## 2013-09-19 DIAGNOSIS — Z79899 Other long term (current) drug therapy: Secondary | ICD-10-CM | POA: Insufficient documentation

## 2013-09-19 DIAGNOSIS — F3289 Other specified depressive episodes: Secondary | ICD-10-CM | POA: Insufficient documentation

## 2013-09-19 DIAGNOSIS — K117 Disturbances of salivary secretion: Secondary | ICD-10-CM | POA: Insufficient documentation

## 2013-09-19 DIAGNOSIS — R110 Nausea: Secondary | ICD-10-CM | POA: Insufficient documentation

## 2013-09-19 DIAGNOSIS — Z87891 Personal history of nicotine dependence: Secondary | ICD-10-CM | POA: Insufficient documentation

## 2013-09-19 DIAGNOSIS — F329 Major depressive disorder, single episode, unspecified: Secondary | ICD-10-CM | POA: Insufficient documentation

## 2013-09-19 LAB — CBC WITH DIFFERENTIAL/PLATELET
BASOS ABS: 0.1 10*3/uL (ref 0.0–0.1)
BASOS PCT: 1 % (ref 0–1)
EOS PCT: 1 % (ref 0–5)
Eosinophils Absolute: 0.1 10*3/uL (ref 0.0–0.7)
HCT: 39.7 % (ref 36.0–46.0)
Hemoglobin: 13.7 g/dL (ref 12.0–15.0)
LYMPHS PCT: 26 % (ref 12–46)
Lymphs Abs: 1.9 10*3/uL (ref 0.7–4.0)
MCH: 31.1 pg (ref 26.0–34.0)
MCHC: 34.5 g/dL (ref 30.0–36.0)
MCV: 90 fL (ref 78.0–100.0)
Monocytes Absolute: 0.7 10*3/uL (ref 0.1–1.0)
Monocytes Relative: 10 % (ref 3–12)
NEUTROS ABS: 4.4 10*3/uL (ref 1.7–7.7)
Neutrophils Relative %: 62 % (ref 43–77)
PLATELETS: 255 10*3/uL (ref 150–400)
RBC: 4.41 MIL/uL (ref 3.87–5.11)
RDW: 12.7 % (ref 11.5–15.5)
WBC: 7.1 10*3/uL (ref 4.0–10.5)

## 2013-09-19 LAB — COMPREHENSIVE METABOLIC PANEL
ALBUMIN: 4.1 g/dL (ref 3.5–5.2)
ALT: 13 U/L (ref 0–35)
AST: 12 U/L (ref 0–37)
Alkaline Phosphatase: 61 U/L (ref 39–117)
BUN: 8 mg/dL (ref 6–23)
CALCIUM: 9.6 mg/dL (ref 8.4–10.5)
CO2: 29 meq/L (ref 19–32)
CREATININE: 0.71 mg/dL (ref 0.50–1.10)
Chloride: 100 mEq/L (ref 96–112)
GFR calc Af Amer: >90 mL/min (ref >90)
Glucose, Bld: 111 mg/dL — ABNORMAL HIGH (ref 70–99)
Potassium: 4.1 mEq/L (ref 3.7–5.3)
SODIUM: 139 meq/L (ref 137–147)
Total Bilirubin: 1 mg/dL (ref 0.3–1.2)
Total Protein: 7 g/dL (ref 6.0–8.3)

## 2013-09-19 LAB — URINALYSIS, ROUTINE W REFLEX MICROSCOPIC
Bilirubin Urine: NEGATIVE
GLUCOSE, UA: NEGATIVE mg/dL
HGB URINE DIPSTICK: NEGATIVE
Ketones, ur: NEGATIVE mg/dL
LEUKOCYTES UA: NEGATIVE
Nitrite: NEGATIVE
PH: 8.5 — AB (ref 5.0–8.0)
PROTEIN: NEGATIVE mg/dL
SPECIFIC GRAVITY, URINE: 1.012 (ref 1.005–1.030)
Urobilinogen, UA: 0.2 mg/dL (ref 0.0–1.0)

## 2013-09-19 LAB — URINE MICROSCOPIC-ADD ON

## 2013-09-19 LAB — POC URINE PREG, ED: Preg Test, Ur: NEGATIVE

## 2013-09-19 MED ORDER — POLYETHYLENE GLYCOL 3350 17 G PO PACK: 17.0000 g | PACK | Freq: Every day | ORAL | Status: DC | PRN

## 2013-09-19 NOTE — ED Notes (Signed)
Per pt, states she is dehydrated although drinks plenty of fluids-states no normal BM for weeks and now having abdominal pain

## 2013-09-19 NOTE — Progress Notes (Signed)
[facility] provided pt with a list of primary care resources. Patient stated that she was pending insurance on 5/1.

## 2013-09-19 NOTE — ED Notes (Addendum)
Pt states has been constipated x several weeks, took magnesium citrate and had some relief, states having abdominal cramping, denies vomiting, states is nauseous at times. Pt also states she has dry mouth.

## 2013-09-19 NOTE — Discharge Instructions (Signed)

## 2013-09-19 NOTE — ED Provider Notes (Signed)
CSN: [id number]     Arrival date & time 09/19/13  1017 History   First MD Initiated Contact with Patient 09/19/13 1046     Chief Complaint  Patient presents with  . dehydrated   . Constipation     (Consider location/radiation/quality/duration/timing/severity/associated sxs/prior Treatment) Patient is a 33 y.o. female presenting with constipation. The history is provided by the patient.  Constipation Associated symptoms: nausea   Associated symptoms: no abdominal pain, no back pain, no diarrhea and no vomiting    patient states she is dehydrated and constipated. She states it mouth has been dry for the last couple weeks. She states that she knows she is dehydrated because her mouth is dry. She states she's been urinating more frequently, but states she thinks it is because she has been drinking more water. She states she's only had one bowel movement in the last 6 days and it came after taking magnesium citrate. Decision has some mild crampy upper abdominal pain. She's worried she may have an obstruction. No fevers. No cough. She's had nausea without vomiting. No swelling or abdomen. She denies possibility of pregnancy. No fevers. No vaginal bleeding or discharge. She has had some change in her psych medications. What to do has been started around a month ago.  Past Medical History  Diagnosis Date  . Renal disorder     renal calculi  . Diverticulosis   . Pancreatitis   . Low serum cortisol level   . Seizure   . Depression   . Anxiety    Past Surgical History  Procedure Laterality Date  . None     Family History  Problem Relation Age of Onset  . Diabetes Mother   . Heart disease Mother   . Hypertension Mother   . Diverticulitis Mother    History  Substance Use Topics  . Smoking status: Former Smoker -- 0.50 packs/day for 9 years    Types: Cigarettes    Quit date: 09/01/2012  . Smokeless tobacco: Never Used  . Alcohol Use: No   OB History   Grav Para Term Preterm  Abortions TAB SAB Ect Mult Living                 Review of Systems  Constitutional: Negative for activity change and appetite change.  Eyes: Negative for pain.  Respiratory: Negative for chest tightness and shortness of breath.   Cardiovascular: Negative for chest pain and leg swelling.  Gastrointestinal: Positive for nausea and constipation. Negative for vomiting, abdominal pain and diarrhea.  Endocrine: Positive for polydipsia.       Dry mouth  Genitourinary: Positive for frequency. Negative for flank pain.  Musculoskeletal: Negative for back pain and neck stiffness.  Skin: Negative for rash.  Neurological: Negative for weakness, numbness and headaches.  Psychiatric/Behavioral: Negative for behavioral problems.      Allergies  Sulfa antibiotics  Home Medications   Prior to Admission medications   Medication Sig Start Date End Date Taking? Authorizing Provider  ARIPiprazole (ABILIFY) 10 MG tablet Take 10 mg by mouth daily.   Yes Historical Provider, MD  Lurasidone HCl (LATUDA) 20 MG TABS Take 20 mg by mouth.   Yes Historical Provider, MD  magnesium citrate SOLN Take 1 Bottle by mouth as needed for severe constipation.   Yes Historical Provider, MD   BP 116/79  Pulse 86  Temp(Src) 98 F (36.7 C) (Oral)  Resp 16  SpO2 100%  LMP 09/05/2013 Physical Exam  Nursing note and vitals reviewed. Constitutional:  She is oriented to person, place, and time. She appears well-developed and well-nourished.  HENT:  Head: Normocephalic and atraumatic.   Mucous membranes are moist  Eyes: EOM are normal. Pupils are equal, round, and reactive to light.  Neck: Normal range of motion. Neck supple.  Cardiovascular: Normal rate, regular rhythm and normal heart sounds.   No murmur heard. Pulmonary/Chest: Effort normal and breath sounds normal. No respiratory distress. She has no wheezes. She has no rales.  Abdominal: Soft. Bowel sounds are normal. She exhibits no distension. There is no  tenderness. There is no rebound and no guarding.  Musculoskeletal: Normal range of motion.  Neurological: She is alert and oriented to person, place, and time. No cranial nerve deficit.  Skin: Skin is warm and dry.  Psychiatric: She has a normal mood and affect. Her speech is normal.    ED Course  Procedures (including critical care time) Labs Review Labs Reviewed  COMPREHENSIVE METABOLIC PANEL - Abnormal; Notable for the following:    Glucose, Bld 111 (*)    All other components within normal limits  URINALYSIS, ROUTINE W REFLEX MICROSCOPIC - Abnormal; Notable for the following:    APPearance TURBID (*)    pH 8.5 (*)    All other components within normal limits  CBC WITH DIFFERENTIAL  URINE MICROSCOPIC-ADD ON  POC URINE PREG, ED    Imaging Review Dg Abd 2 Views  09/19/2013   CLINICAL DATA:  Right-sided abdominal pain. Constipation. Nausea and loss of appetite.  EXAM: ABDOMEN - 2 VIEW  COMPARISON:  06/26/2011  FINDINGS: Mild increased stool burden in the colon. No bowel dilation to suggest obstruction or adynamic ileus. No free air. No evidence of renal or ureteral calculi.  Mild curvature of the thoracolumbar spine, stable. Bony structures otherwise unremarkable. Clear lung bases.  IMPRESSION: 1. No acute findings. 2. Mild increased stool burden in the colon.   Electronically Signed   By: Amie Portlandavid  Ormond M.D.   On: 09/19/2013 11:34     EKG Interpretation None      MDM   Final diagnoses:  Constipation  Dry mouth    Patient presents with dry mouth and a feeling of constipation. States she has been thirsty for a while. She has had some recent change in her medications has gone off her Abilify and started on a student. She did have a bowel movement with magnesium citrate and within the last couple days. She does not appear to be obstructed although there is a increased stool burden on x-ray. We'll treat with MiraLAX. I doubt obstruction at this time. Patient's lab work is  reassuring. I doubt severe dehydration. Could be related to medication or medication withdrawal period will need to followup with her prescribing physicians.    Juliet RudeNathan R. Rubin PayorPickering, MD 09/19/13 1225

## 2015-09-14 ENCOUNTER — Emergency Department (HOSPITAL_COMMUNITY)
Admission: EM | Admit: 2015-09-14 | Discharge: 2015-09-14 | Disposition: A | Discharge status: Home / Self Care | Payer: Medicaid Other | Attending: Emergency Medicine | Admitting: Emergency Medicine

## 2015-09-14 ENCOUNTER — Emergency Department (HOSPITAL_COMMUNITY): Payer: Medicaid Other

## 2015-09-14 ENCOUNTER — Encounter (HOSPITAL_COMMUNITY): Payer: Self-pay | Admitting: *Deleted

## 2015-09-14 DIAGNOSIS — R112 Nausea with vomiting, unspecified: Secondary | ICD-10-CM | POA: Insufficient documentation

## 2015-09-14 DIAGNOSIS — Z8659 Personal history of other mental and behavioral disorders: Secondary | ICD-10-CM | POA: Insufficient documentation

## 2015-09-14 DIAGNOSIS — Z8639 Personal history of other endocrine, nutritional and metabolic disease: Secondary | ICD-10-CM | POA: Insufficient documentation

## 2015-09-14 DIAGNOSIS — R1011 Right upper quadrant pain: Secondary | ICD-10-CM | POA: Insufficient documentation

## 2015-09-14 DIAGNOSIS — F17210 Nicotine dependence, cigarettes, uncomplicated: Secondary | ICD-10-CM | POA: Insufficient documentation

## 2015-09-14 DIAGNOSIS — Z8719 Personal history of other diseases of the digestive system: Secondary | ICD-10-CM | POA: Insufficient documentation

## 2015-09-14 DIAGNOSIS — Z87442 Personal history of urinary calculi: Secondary | ICD-10-CM | POA: Insufficient documentation

## 2015-09-14 LAB — COMPREHENSIVE METABOLIC PANEL
ALK PHOS: 59 U/L (ref 38–126)
ALT: 14 U/L (ref 14–54)
ANION GAP: 11 (ref 5–15)
AST: 14 U/L — ABNORMAL LOW (ref 15–41)
Albumin: 3.9 g/dL (ref 3.5–5.0)
BILIRUBIN TOTAL: 0.8 mg/dL (ref 0.3–1.2)
BUN: 9 mg/dL (ref 6–20)
CALCIUM: 9.2 mg/dL (ref 8.9–10.3)
CO2: 24 mmol/L (ref 22–32)
CREATININE: 0.7 mg/dL (ref 0.44–1.00)
Chloride: 104 mmol/L (ref 101–111)
Glucose, Bld: 85 mg/dL (ref 65–99)
Potassium: 4 mmol/L (ref 3.5–5.1)
SODIUM: 139 mmol/L (ref 135–145)
TOTAL PROTEIN: 6.7 g/dL (ref 6.5–8.1)

## 2015-09-14 LAB — CBC
HCT: 39.6 % (ref 36.0–46.0)
HEMOGLOBIN: 13.5 g/dL (ref 12.0–15.0)
MCH: 31.2 pg (ref 26.0–34.0)
MCHC: 34.1 g/dL (ref 30.0–36.0)
MCV: 91.5 fL (ref 78.0–100.0)
PLATELETS: 298 10*3/uL (ref 150–400)
RBC: 4.33 MIL/uL (ref 3.87–5.11)
RDW: 12.7 % (ref 11.5–15.5)
WBC: 8.5 10*3/uL (ref 4.0–10.5)

## 2015-09-14 LAB — URINALYSIS, ROUTINE W REFLEX MICROSCOPIC
BILIRUBIN URINE: NEGATIVE
Glucose, UA: NEGATIVE mg/dL
Hgb urine dipstick: NEGATIVE
Ketones, ur: NEGATIVE mg/dL
Leukocytes, UA: NEGATIVE
NITRITE: NEGATIVE
PROTEIN: NEGATIVE mg/dL
SPECIFIC GRAVITY, URINE: 1.004 — AB (ref 1.005–1.030)
pH: 6.5 (ref 5.0–8.0)

## 2015-09-14 LAB — LIPASE, BLOOD: Lipase: 37 U/L (ref 11–51)

## 2015-09-14 MED ORDER — TRAMADOL HCL 50 MG PO TABS: 50.0000 mg | ORAL_TABLET | Freq: Two times a day (BID) | ORAL | Status: DC | PRN

## 2015-09-14 MED ORDER — SODIUM CHLORIDE 0.9 % IV BOLUS (SEPSIS)
1000.0000 mL | Freq: Once | INTRAVENOUS | Status: AC
  Administered 2015-09-14: 1000 mL via INTRAVENOUS

## 2015-09-14 MED ORDER — ONDANSETRON 4 MG PO TBDP: 4.0000 mg | ORAL_TABLET | Freq: Three times a day (TID) | ORAL | Status: DC | PRN

## 2015-09-14 MED ORDER — MORPHINE SULFATE (PF) 4 MG/ML IV SOLN
4.0000 mg | Freq: Once | INTRAVENOUS | Status: AC
  Administered 2015-09-14: 4 mg via INTRAVENOUS
  Filled 2015-09-14: qty 1

## 2015-09-14 NOTE — ED Provider Notes (Signed)
CSN: [id number]     Arrival date & time 09/14/15  0357 History   First MD Initiated Contact with Patient 09/14/15 0454     Chief Complaint  Patient presents with  . Abdominal Pain     (Consider location/radiation/quality/duration/timing/severity/associated sxs/prior Treatment) HPI Paige Fields is a 35 y.o. female with past medical history of pancreatitis presenting today with right upper quadrant abdominal pain. Patient states this is been going on for the past 4 days. It is made worse after eating. She has nausea and vomiting as well. She's changed her diet to fruits and vegetables and this seems to have improved her symptoms. She denies any fevers. She's had no abdominal surgeries. She denies feeling like this in the past. She is not taking any medicines to help. There are no further complaints.  10 Systems reviewed and are negative for acute change except as noted in the HPI.     Past Medical History  Diagnosis Date  . Renal disorder     renal calculi  . Diverticulosis   . Pancreatitis   . Low serum cortisol level (HCC)   . Seizure (HCC)   . Depression   . Anxiety    Past Surgical History  Procedure Laterality Date  . None     Family History  Problem Relation Age of Onset  . Diabetes Mother   . Heart disease Mother   . Hypertension Mother   . Diverticulitis Mother    Social History  Substance Use Topics  . Smoking status: Current Some Day Smoker -- 0.50 packs/day for 9 years    Types: Cigarettes    Last Attempt to Quit: 09/01/2012  . Smokeless tobacco: Never Used  . Alcohol Use: Yes   OB History    No data available     Review of Systems    Allergies  Sulfa antibiotics  Home Medications   Prior to Admission medications   Medication Sig Start Date End Date Taking? Authorizing Provider  polyethylene glycol (MIRALAX / GLYCOLAX) packet Take 17 g by mouth daily as needed for mild constipation. Patient not taking: Reported on 09/14/2015 09/19/13   Benjiman CoreNathan  Pickering, MD   BP 113/77 mmHg  Pulse 75  Temp(Src) 98.7 F (37.1 C) (Oral)  Resp 16  Ht 5\' 5"  (1.651 m)  Wt 128 lb (58.06 kg)  BMI 21.30 kg/m2  SpO2 100%  LMP 08/31/2015 Physical Exam  Constitutional: She is oriented to person, place, and time. She appears well-developed and well-nourished. No distress.  HENT:  Head: Normocephalic and atraumatic.  Nose: Nose normal.  Mouth/Throat: Oropharynx is clear and moist. No oropharyngeal exudate.  Eyes: Conjunctivae and EOM are normal. Pupils are equal, round, and reactive to light. No scleral icterus.  Neck: Normal range of motion. Neck supple. No JVD present. No tracheal deviation present. No thyromegaly present.  Cardiovascular: Normal rate, regular rhythm and normal heart sounds.  Exam reveals no gallop and no friction rub.   No murmur heard. Pulmonary/Chest: Effort normal and breath sounds normal. No respiratory distress. She has no wheezes. She exhibits no tenderness.  Abdominal: Soft. Bowel sounds are normal. She exhibits no distension and no mass. There is tenderness. There is no rebound and no guarding.  Right upper quadrant tenderness to palpation. Positive Murphy's sign.  Musculoskeletal: Normal range of motion. She exhibits no edema or tenderness.  Lymphadenopathy:    She has no cervical adenopathy.  Neurological: She is alert and oriented to person, place, and time. No cranial nerve  deficit. She exhibits normal muscle tone.  Skin: Skin is warm and dry. No rash noted. No erythema. No pallor.  Nursing note and vitals reviewed.   ED Course  Procedures (including critical care time) Labs Review Labs Reviewed  COMPREHENSIVE METABOLIC PANEL - Abnormal; Notable for the following:    AST 14 (*)    All other components within normal limits  URINALYSIS, ROUTINE W REFLEX MICROSCOPIC (NOT AT [facility]) - Abnormal; Notable for the following:    Specific Gravity, Urine 1.004 (*)    All other components within normal limits  LIPASE, BLOOD   CBC    Imaging Review US Abdomen Limited Ruq  09/14/2015  CLINICAL DATA:  Right upper quadrant pain for 4 days, worse after eating. EXAM: US ABDOMEN LIMITED - RIGHT UPPER QUADRANT COMPARISON:  CT abdomen and pelvis 09/16/2012 FINDINGS: Gallbladder: No gallstones or wall thickening visualized. No sonographic Murphy sign noted by sonographer. Common bile duct: Diameter: 3.6 mm, normal Liver: No focal lesion identified. Within normal limits in parenchymal echogenicity. IMPRESSION: Normal examination. Electronically Signed   By: Burman Nieves M.D.   On: 09/14/2015 05:39   I have personally reviewed and evaluated these images and lab results as part of my medical decision-making.   EKG Interpretation None      MDM   Final diagnoses:  RUQ abdominal pain   patient presents emergency department for abdominal pain nausea and vomiting. I concern for gallbladder pathology. Will obtain ultrasound for evaluation. Patient given morphine for pain control. IV fluids were ordered.  6:24 AM laboratory studies are unremarkable. Upon repeat evaluation, patient states her pain is resolved. She has no nausea. Ultrasound does not reveal any abnormalities in the gallbladder. She is advised to follow-up with her primary care physician within 3 days for further management. Vital signs were within her normal limits and she is safe for discharge.    Tomasita Crumble, MD 09/14/15 [phone]

## 2015-09-14 NOTE — Discharge Instructions (Signed)
Abdominal Pain, Adult Paige Fields, your ultrasound did not show any abnormalities with your gallbladder. The results are below. See a primary care physician within 3 days for close follow-up. Take Zofran as needed for nausea. Take tramadol as needed for severe pain. For any worsening symptoms come back to emergency department immediately. Thank you.  CLINICAL DATA: Right upper quadrant pain for 4 days, worse after eating.  EXAM: US ABDOMEN LIMITED - RIGHT UPPER QUADRANT  COMPARISON: CT abdomen and pelvis 09/16/2012  FINDINGS: Gallbladder:  No gallstones or wall thickening visualized. No sonographic Murphy sign noted by sonographer.  Common bile duct:  Diameter: 3.6 mm, normal  Liver:  No focal lesion identified. Within normal limits in parenchymal echogenicity.  IMPRESSION: Normal examination.  Many things can cause belly (abdominal) pain. Most times, the belly pain is not dangerous. Many cases of belly pain can be watched and treated at home. HOME CARE   Do not take medicines that help you go poop (laxatives) unless told to by your doctor.  Only take medicine as told by your doctor.  Eat or drink as told by your doctor. Your doctor will tell you if you should be on a special diet. GET HELP IF:  You do not know what is causing your belly pain.  You have belly pain while you are sick to your stomach (nauseous) or have runny poop (diarrhea).  You have pain while you pee or poop.  Your belly pain wakes you up at night.  You have belly pain that gets worse or better when you eat.  You have belly pain that gets worse when you eat fatty foods.  You have a fever. GET HELP RIGHT AWAY IF:   The pain does not go away within 2 hours.  You keep throwing up (vomiting).  The pain changes and is only in the right or left part of the belly.  You have bloody or tarry looking poop. MAKE SURE YOU:   Understand these instructions.  Will watch your condition.  Will get  help right away if you are not doing well or get worse.   This information is not intended to replace advice given to you by your health care provider. Make sure you discuss any questions you have with your health care provider.   Document Released: 10/29/2007 Document Revised: 06/02/2014 Document Reviewed: 01/19/2013 Elsevier Interactive Patient Education [employer]2016 Elsevier Inc.

## 2015-09-14 NOTE — ED Notes (Signed)
C/o left upper  abd pain onset 4 days ago. States hurts worse after eating. C/o swelling in abd today

## 2015-09-14 NOTE — ED Notes (Signed)
Patient not in room at this time. Patient in US.

## 2016-10-10 ENCOUNTER — Encounter (HOSPITAL_COMMUNITY): Payer: Self-pay | Admitting: Emergency Medicine

## 2016-10-10 ENCOUNTER — Ambulatory Visit (HOSPITAL_COMMUNITY)
Admission: EM | Admit: 2016-10-10 | Discharge: 2016-10-10 | Disposition: A | Discharge status: Home / Self Care | Payer: Medicaid Other | Attending: Internal Medicine | Admitting: Internal Medicine

## 2016-10-10 DIAGNOSIS — F419 Anxiety disorder, unspecified: Secondary | ICD-10-CM | POA: Insufficient documentation

## 2016-10-10 DIAGNOSIS — R300 Dysuria: Secondary | ICD-10-CM

## 2016-10-10 DIAGNOSIS — Z87442 Personal history of urinary calculi: Secondary | ICD-10-CM | POA: Insufficient documentation

## 2016-10-10 DIAGNOSIS — F329 Major depressive disorder, single episode, unspecified: Secondary | ICD-10-CM | POA: Insufficient documentation

## 2016-10-10 DIAGNOSIS — F17210 Nicotine dependence, cigarettes, uncomplicated: Secondary | ICD-10-CM | POA: Insufficient documentation

## 2016-10-10 DIAGNOSIS — N390 Urinary tract infection, site not specified: Secondary | ICD-10-CM | POA: Insufficient documentation

## 2016-10-10 DIAGNOSIS — R569 Unspecified convulsions: Secondary | ICD-10-CM | POA: Insufficient documentation

## 2016-10-10 LAB — POCT URINALYSIS DIP (DEVICE)
Bilirubin Urine: NEGATIVE
Glucose, UA: NEGATIVE mg/dL
HGB URINE DIPSTICK: NEGATIVE
Ketones, ur: NEGATIVE mg/dL
LEUKOCYTES UA: NEGATIVE
Nitrite: NEGATIVE
PROTEIN: NEGATIVE mg/dL
Specific Gravity, Urine: 1.01 (ref 1.005–1.030)
UROBILINOGEN UA: 0.2 mg/dL (ref 0.0–1.0)
pH: 7 (ref 5.0–8.0)

## 2016-10-10 MED ORDER — ONDANSETRON 4 MG PO TBDP: 4.0000 mg | ORAL_TABLET | Freq: Three times a day (TID) | ORAL | 0 refills | Status: DC | PRN

## 2016-10-10 MED ORDER — CEPHALEXIN 500 MG PO CAPS: 500.0000 mg | ORAL_CAPSULE | Freq: Four times a day (QID) | ORAL | 0 refills | Status: DC

## 2016-10-10 NOTE — Discharge Instructions (Signed)
You are being treated today for a urinary tract infection. I have prescribed Keflex, take 1 tablet 4 times a day for 5 days. For Nausea, I have prescribed Zofran, take 1 tablet under the tongue every 8 hours as needed. Your urine will be sent for culture and you will be notified should any change in therapy be needed. Drink plenty of fluids and rest. Should your symptoms fail to resolve, follow up with your primary care provider or return to clinic.

## 2016-10-10 NOTE — ED Triage Notes (Signed)
Pt states she had a UTI on Monday that she took OTC Azo for.  She states those symptoms went away, but she started developing right flank pain that now radiates to the left and some mild nausea.

## 2016-10-10 NOTE — ED Provider Notes (Signed)
CSN: [id number]     Arrival date & time 10/10/16  1443 History   First MD Initiated Contact with Patient 10/10/16 1531     Chief Complaint  Patient presents with  . Urinary Tract Infection   (Consider location/radiation/quality/duration/timing/severity/associated sxs/prior Treatment) The history is provided by the patient.  Urinary Tract Infection  Pain quality:  Aching and sharp Pain severity:  Moderate Onset quality:  Gradual Duration:  3 days Timing:  Constant Progression:  Worsening Chronicity:  New Recent urinary tract infections: yes   Relieved by:  Cranberry juice and phenazopyridine Worsened by:  Nothing Urinary symptoms: discolored urine and frequent urination   Associated symptoms: abdominal pain, flank pain and nausea   Associated symptoms: no fever, no genital lesions, no vaginal discharge and no vomiting     Past Medical History:  Diagnosis Date  . Anxiety   . Depression   . Diverticulosis   . Low serum cortisol level (HCC)   . Pancreatitis   . Renal disorder    renal calculi  . Seizure [facility](HCC)    Past Surgical History:  Procedure Laterality Date  . None     Family History  Problem Relation Age of Onset  . Diabetes Mother   . Heart disease Mother   . Hypertension Mother   . Diverticulitis Mother    Social History  Substance Use Topics  . Smoking status: Current Some Day Smoker    Packs/day: 0.30    Years: 9.00    Types: Cigarettes    Last attempt to quit: 09/01/2012  . Smokeless tobacco: Never Used  . Alcohol use Yes     Comment: occasional   OB History    No data available     Review of Systems  Constitutional: Negative for fever.  HENT: Negative.   Respiratory: Negative.   Cardiovascular: Negative.   Gastrointestinal: Positive for abdominal pain and nausea. Negative for vomiting.  Genitourinary: Positive for dysuria, flank pain and frequency. Negative for difficulty urinating, dyspareunia, pelvic pain, urgency, vaginal discharge and  vaginal pain.  Musculoskeletal: Negative for back pain and joint swelling.  Skin: Negative.   Neurological: Negative.     Allergies  Sulfa antibiotics  Home Medications   Prior to Admission medications   Medication Sig Start Date End Date Taking? Authorizing Provider  cephALEXin (KEFLEX) 500 MG capsule Take 1 capsule (500 mg total) by mouth 4 (four) times daily. 10/10/16   Dorena BodoKennard, Marlen Koman, NP  ondansetron (ZOFRAN ODT) 4 MG disintegrating tablet Take 1 tablet (4 mg total) by mouth every 8 (eight) hours as needed for nausea or vomiting. 10/10/16   Dorena BodoKennard, Makilah Dowda, NP   Meds Ordered and Administered this Visit  Medications - No data to display  BP 128/90 (BP Location: Right Arm)   Pulse 91   Temp 98.3 F (36.8 C) (Oral)   LMP 09/16/2016 (Approximate)   SpO2 100%  No data found.   Physical Exam  Constitutional: She is oriented to person, place, and time. She appears well-developed and well-nourished.  HENT:  Head: Normocephalic.  Right Ear: External ear normal.  Left Ear: External ear normal.  Eyes: Conjunctivae are normal. Right eye exhibits no discharge. Left eye exhibits no discharge.  Neck: Normal range of motion.  Cardiovascular: Normal rate and regular rhythm.   Pulmonary/Chest: Effort normal and breath sounds normal.  Abdominal: Bowel sounds are normal. There is no tenderness. There is CVA tenderness. There is no tenderness at McBurney's point and negative Murphy's sign.  Neurological: She is  oriented to person, place, and time.  Skin: Skin is warm and dry. Capillary refill takes less than 2 seconds.  Psychiatric: She has a normal mood and affect. Her behavior is normal.  Nursing note and vitals reviewed.   Urgent Care Course     Procedures (including critical care time)  Labs Review Labs Reviewed  URINE CULTURE  POCT URINALYSIS DIP (DEVICE)    Imaging Review No results found.      MDM   1. Lower urinary tract infectious disease    Treating for  UTI, urine sent for culture, started on Keflex and Zofran, return to clinic as needed.    Dorena Bodo, NP 10/10/16 1547

## 2016-10-11 LAB — URINE CULTURE: Culture: <10000 — AB

## 2017-06-26 ENCOUNTER — Encounter (HOSPITAL_COMMUNITY): Payer: Self-pay | Admitting: Emergency Medicine

## 2017-06-26 ENCOUNTER — Ambulatory Visit (HOSPITAL_COMMUNITY)
Admission: EM | Admit: 2017-06-26 | Discharge: 2017-06-26 | Disposition: A | Discharge status: Home / Self Care | Payer: Self-pay | Attending: Internal Medicine | Admitting: Internal Medicine

## 2017-06-26 DIAGNOSIS — Z79899 Other long term (current) drug therapy: Secondary | ICD-10-CM | POA: Insufficient documentation

## 2017-06-26 DIAGNOSIS — B349 Viral infection, unspecified: Secondary | ICD-10-CM | POA: Insufficient documentation

## 2017-06-26 DIAGNOSIS — F17210 Nicotine dependence, cigarettes, uncomplicated: Secondary | ICD-10-CM | POA: Insufficient documentation

## 2017-06-26 DIAGNOSIS — R0602 Shortness of breath: Secondary | ICD-10-CM

## 2017-06-26 DIAGNOSIS — Z87891 Personal history of nicotine dependence: Secondary | ICD-10-CM | POA: Insufficient documentation

## 2017-06-26 DIAGNOSIS — R05 Cough: Secondary | ICD-10-CM

## 2017-06-26 DIAGNOSIS — Z882 Allergy status to sulfonamides status: Secondary | ICD-10-CM | POA: Insufficient documentation

## 2017-06-26 DIAGNOSIS — R509 Fever, unspecified: Secondary | ICD-10-CM

## 2017-06-26 LAB — POCT RAPID STREP A: Streptococcus, Group A Screen (Direct): NEGATIVE

## 2017-06-26 MED ORDER — ONDANSETRON 4 MG PO TBDP: 4.0000 mg | ORAL_TABLET | Freq: Three times a day (TID) | ORAL | 0 refills | Status: DC | PRN

## 2017-06-26 MED ORDER — BENZONATATE 100 MG PO CAPS: 100.0000 mg | ORAL_CAPSULE | Freq: Three times a day (TID) | ORAL | 0 refills | Status: DC

## 2017-06-26 MED ORDER — FLUTICASONE PROPIONATE 50 MCG/ACT NA SUSP: 2.0000 | Freq: Every day | NASAL | 0 refills | Status: DC

## 2017-06-26 MED ORDER — CETIRIZINE-PSEUDOEPHEDRINE ER 5-120 MG PO TB12
1.0000 | ORAL_TABLET | Freq: Every day | ORAL | 0 refills | Status: DC
Start: 2017-06-26 — End: 2017-11-24

## 2017-06-26 MED ORDER — IPRATROPIUM-ALBUTEROL 0.5-2.5 (3) MG/3ML IN SOLN
3.0000 mL | Freq: Once | RESPIRATORY_TRACT | Status: AC
  Administered 2017-06-26: 3 mL via RESPIRATORY_TRACT

## 2017-06-26 MED ORDER — IPRATROPIUM-ALBUTEROL 0.5-2.5 (3) MG/3ML IN SOLN
RESPIRATORY_TRACT | Status: AC
  Filled 2017-06-26: qty 3

## 2017-06-26 MED ORDER — ALBUTEROL SULFATE HFA 108 (90 BASE) MCG/ACT IN AERS: 1.0000 | INHALATION_SPRAY | Freq: Four times a day (QID) | RESPIRATORY_TRACT | 0 refills | Status: DC | PRN

## 2017-06-26 NOTE — Discharge Instructions (Signed)
Tessalon for cough. Albuterol as needed for shortness of breath, wheezing. Start flonase, zyrtec-D for nasal congestion/drainage. You can use over the counter nasal saline rinse such as neti pot for nasal congestion. Keep hydrated, your urine should be clear to pale yellow in color. Tylenol/motrin for fever and pain. Monitor for any worsening of symptoms, chest pain, shortness of breath, wheezing, swelling of the throat, go to the emergency department for further evaluation.

## 2017-06-26 NOTE — ED Triage Notes (Signed)
PT C/O: cold sx onset 5 days associated w/dry cough, CP due to cough, SOB, fever, decreased appetite, fevers  TAKING MEDS: Nyquil  A&O x4... NAD... Ambulatory

## 2017-06-26 NOTE — ED Provider Notes (Signed)
[facility]    CSN: [id number] Arrival date & time: 06/26/17  1347     History   Chief Complaint Chief Complaint  Patient presents with  . URI    HPI JOHANNAH ROZAS is a 37 y.o. female.   37 year old female comes in for 5-day history of URI symptoms.  She has had nonproductive cough, chest soreness due to cough, shortness of breath, fever, decreased appetite.  She is taking NyQuil. tmax 101, tylenol, fever resolved since yesterday. Denies history of asthma. Former smoker, 3-5 years, 0.5 ppd at one point, and decreased to 2-3 cigs/day, quit a few months ago. Has never had to use inhaler.       Past Medical History:  Diagnosis Date  . Anxiety   . Depression   . Diverticulosis   . Low serum cortisol level (HCC)   . Pancreatitis   . Renal disorder    renal calculi  . Seizure [facility])     Patient Active Problem List   Diagnosis Date Noted  . Seizure (HCC)   . Anxiety   . Low serum cortisol level (HCC) 09/18/2012  . Acute pyelonephritis 09/16/2012  . Sepsis (HCC) 09/16/2012  . Dehydration with hyponatremia 09/16/2012  . Hypotension 09/16/2012  . Leukocytosis 09/16/2012  . Anemia 09/16/2012  . Severe sepsis with septic shock (HCC) 09/16/2012  . Hydroureter on left 09/16/2012    Past Surgical History:  Procedure Laterality Date  . None      OB History    No data available       Home Medications    Prior to Admission medications   Medication Sig Start Date End Date Taking? Authorizing Provider  albuterol (PROVENTIL HFA;VENTOLIN HFA) 108 (90 Base) MCG/ACT inhaler Inhale 1-2 puffs into the lungs every 6 (six) hours as needed for wheezing or shortness of breath. 06/26/17   Cathie Hoops, Amy V, PA-C  benzonatate (TESSALON) 100 MG capsule Take 1 capsule (100 mg total) by mouth every 8 (eight) hours. 06/26/17   Cathie Hoops, Amy V, PA-C  cetirizine-pseudoephedrine (ZYRTEC-D) 5-120 MG tablet Take 1 tablet by mouth daily. 06/26/17   Cathie Hoops, Amy V, PA-C  fluticasone (FLONASE) 50 MCG/ACT  nasal spray Place 2 sprays into both nostrils daily. 06/26/17   Cathie Hoops, Amy V, PA-C  ondansetron (ZOFRAN ODT) 4 MG disintegrating tablet Take 1 tablet (4 mg total) by mouth every 8 (eight) hours as needed for nausea or vomiting. 06/26/17   Belinda Fisher, PA-C    Family History Family History  Problem Relation Age of Onset  . Diabetes Mother   . Heart disease Mother   . Hypertension Mother   . Diverticulitis Mother     Social History Social History   Tobacco Use  . Smoking status: Current Some Day Smoker    Packs/day: 0.30    Years: 9.00    Pack years: 2.70    Types: Cigarettes    Last attempt to quit: 09/01/2012    Years since quitting: 4.8  . Smokeless tobacco: Never Used  Substance Use Topics  . Alcohol use: Yes    Comment: occasional  . Drug use: No    Comment: none since 07/2012     Allergies   Sulfa antibiotics   Review of Systems Review of Systems  Reason unable to perform ROS: See HPI as above.     Physical Exam Triage Vital Signs ED Triage Vitals  Enc Vitals Group     BP 06/26/17 1425 (!) 121/91  Pulse Rate 06/26/17 1425 90     Resp 06/26/17 1425 16     Temp 06/26/17 1425 98.7 F (37.1 C)     Temp Source 06/26/17 1425 Oral     SpO2 06/26/17 1425 100 %     Weight --      Height --      Head Circumference --      Peak Flow --      Pain Score 06/26/17 1423 4     Pain Loc --      Pain Edu? --      Excl. in GC? --    No data found.  Updated Vital Signs BP (!) 121/91 (BP Location: Left Arm)   Pulse 90   Temp 98.7 F (37.1 C) (Oral)   Resp 16   LMP 05/29/2017   SpO2 100%   Physical Exam  Constitutional: She is oriented to person, place, and time. She appears well-developed and well-nourished. No distress.  HENT:  Head: Normocephalic and atraumatic.  Right Ear: Tympanic membrane, external ear and ear canal normal. Tympanic membrane is not erythematous and not bulging.  Left Ear: Tympanic membrane, external ear and ear canal normal. Tympanic membrane  is not erythematous and not bulging.  Nose: Nose normal. Right sinus exhibits no maxillary sinus tenderness and no frontal sinus tenderness. Left sinus exhibits no maxillary sinus tenderness and no frontal sinus tenderness.  Mouth/Throat: Uvula is midline, oropharynx is clear and moist and mucous membranes are normal.  Eyes: Conjunctivae are normal. Pupils are equal, round, and reactive to light.  Neck: Normal range of motion. Neck supple.  Cardiovascular: Normal rate, regular rhythm and normal heart sounds. Exam reveals no gallop and no friction rub.  No murmur heard. Pulmonary/Chest: Effort normal and breath sounds normal. She has no decreased breath sounds. She has no wheezes. She has no rhonchi. She has no rales. She exhibits tenderness.  Lymphadenopathy:    She has no cervical adenopathy.  Neurological: She is alert and oriented to person, place, and time.  Skin: Skin is warm and dry.  Psychiatric: She has a normal mood and affect. Her behavior is normal. Judgment normal.    UC Treatments / Results  Labs (all labs ordered are listed, but only abnormal results are displayed) Labs Reviewed  CULTURE, GROUP A STREP [facility])  POCT RAPID STREP A    EKG  EKG Interpretation None       Radiology No results found.  Procedures Procedures (including critical care time)  Medications Ordered in UC Medications  ipratropium-albuterol (DUONEB) 0.5-2.5 (3) MG/3ML nebulizer solution 3 mL (3 mLs Nebulization Given 06/26/17 1519)     Initial Impression / Assessment and Plan / UC Course  I have reviewed the triage vital signs and the nursing notes.  Pertinent labs & imaging results that were available during my care of the patient were reviewed by me and considered in my medical decision making (see chart for details).    Patient complaining of inability to take deep breath, will try DuoNeb and reevaluate.  Patient states improvement of symptoms after DuoNeb treatment.  Will prescribe  albuterol as needed for shortness of breath and wheezing.  Other symptomatic treatment discussed.  Push fluids.  Return precautions given.  Patient expresses understanding and agrees to plan.  Final Clinical Impressions(s) / UC Diagnoses   Final diagnoses:  Viral syndrome    ED Discharge Orders        Ordered    albuterol (PROVENTIL HFA;VENTOLIN HFA) 108 (  90 Base) MCG/ACT inhaler  Every 6 hours PRN     06/26/17 1540    ondansetron (ZOFRAN ODT) 4 MG disintegrating tablet  Every 8 hours PRN     06/26/17 1540    benzonatate (TESSALON) 100 MG capsule  Every 8 hours     06/26/17 1540    fluticasone (FLONASE) 50 MCG/ACT nasal spray  Daily     06/26/17 1540    cetirizine-pseudoephedrine (ZYRTEC-D) 5-120 MG tablet  Daily     06/26/17 1540        Belinda FisherYu, Amy V, [place]PA-C 06/26/17 1552

## 2017-06-29 LAB — CULTURE, GROUP A STREP (THRC)

## 2017-11-24 ENCOUNTER — Inpatient Hospital Stay (HOSPITAL_COMMUNITY)
Admission: AD | Admit: 2017-11-24 | Discharge: 2017-11-24 | Disposition: A | Discharge status: Home / Self Care | Payer: Medicaid Other | Source: Ambulatory Visit | Attending: Obstetrics and Gynecology | Admitting: Obstetrics and Gynecology

## 2017-11-24 ENCOUNTER — Other Ambulatory Visit: Payer: Self-pay

## 2017-11-24 ENCOUNTER — Encounter (HOSPITAL_COMMUNITY): Payer: Self-pay

## 2017-11-24 DIAGNOSIS — Z79899 Other long term (current) drug therapy: Secondary | ICD-10-CM | POA: Insufficient documentation

## 2017-11-24 DIAGNOSIS — R102 Pelvic and perineal pain: Secondary | ICD-10-CM | POA: Insufficient documentation

## 2017-11-24 DIAGNOSIS — Z882 Allergy status to sulfonamides status: Secondary | ICD-10-CM | POA: Insufficient documentation

## 2017-11-24 DIAGNOSIS — Z87891 Personal history of nicotine dependence: Secondary | ICD-10-CM | POA: Insufficient documentation

## 2017-11-24 DIAGNOSIS — Z975 Presence of (intrauterine) contraceptive device: Secondary | ICD-10-CM | POA: Insufficient documentation

## 2017-11-24 DIAGNOSIS — F329 Major depressive disorder, single episode, unspecified: Secondary | ICD-10-CM | POA: Insufficient documentation

## 2017-11-24 DIAGNOSIS — F419 Anxiety disorder, unspecified: Secondary | ICD-10-CM | POA: Insufficient documentation

## 2017-11-24 LAB — URINALYSIS, ROUTINE W REFLEX MICROSCOPIC
Bilirubin Urine: NEGATIVE
Glucose, UA: NEGATIVE mg/dL
Hgb urine dipstick: NEGATIVE
Ketones, ur: NEGATIVE mg/dL
LEUKOCYTES UA: NEGATIVE
Nitrite: NEGATIVE
PROTEIN: NEGATIVE mg/dL
SPECIFIC GRAVITY, URINE: 1.014 (ref 1.005–1.030)
pH: 8 (ref 5.0–8.0)

## 2017-11-24 LAB — POCT PREGNANCY, URINE: PREG TEST UR: NEGATIVE

## 2017-11-24 MED ORDER — OXYCODONE-ACETAMINOPHEN 5-325 MG PO TABS: 1.0000 | ORAL_TABLET | ORAL | 0 refills | Status: DC | PRN

## 2017-11-24 NOTE — [facility]U Note (Signed)
Having some abd pain and nausea, some vaginal pain.  Thinks her IUD is displaced and maybe perforated something.  Had intercourse on Sunday, became very painful.

## 2017-11-24 NOTE — Discharge Instructions (Signed)
-  Try percocet for pain, [facility]  -[facility].    Ovarian Cyst An ovarian cyst is a fluid-filled sac on an ovary. The ovaries are organs that make eggs in women. Most ovarian cysts go away on their own and are not cancerous (are benign). Some cysts need treatment. Follow these instructions at home:  Take over-the-counter and prescription medicines only as told by your doctor.  Do not drive or use heavy machinery while taking prescription pain medicine.  Get pelvic exams and Pap tests as often as told by your doctor.  Return to your normal activities as told by your doctor. Ask your doctor what activities are safe for you.  Do not use any products that contain nicotine or tobacco, such as cigarettes and e-cigarettes. If you need help quitting, ask your doctor.  Keep all follow-up visits as told by your doctor. This is important. Contact a doctor if:  Your periods are: ? Late. ? Irregular. ? Painful.  Your periods stop.  You have pelvic pain that does not go away.  You have pressure on your bladder.  You have trouble making your bladder empty when you pee (urinate).  You have pain during sex.  You have any of the following in your belly (abdomen): ? A feeling of fullness. ? Pressure. ? Discomfort. ? Pain that does not go away. ? Swelling.  You feel sick most of the time.  You have trouble pooping (have constipation).  You are not as hungry as usual (you lose your appetite).  You get very bad acne.  You start to have more hair on your body and face.  You are gaining weight or losing weight without changing your exercise and eating habits.  You think you may be pregnant. Get help right away if:  You have belly pain that is very bad or gets worse.  You cannot eat or drink without throwing up (vomiting).  You suddenly get a fever.  Your period is a lot heavier than usual. This  information is not intended to replace advice given to you by your health care provider. Make sure you discuss any questions you have with your health care provider. Document Released: 10/29/2007 Document Revised: 11/30/2015 Document Reviewed: 10/14/2015 Elsevier Interactive Patient Education  [employer]2018 Else[employer] Inc.

## 2017-11-24 NOTE — MAU Provider Note (Addendum)
Patient Paige Fields is a 37 y.o. G1P0010 At Unknown here with complaints of pelvic pain since Sunday, June 30th. She has an IUD in place and thinks it is related to that. She denies burning with urination, hasn't had a period in "a long time"; abnormal discharge or signs of infection.    History     CSN: [id number]  Arrival date and time: 11/24/17 1414   First Provider Initiated Contact with Patient 11/24/17 1647      Chief Complaint  Patient presents with  . Abdominal Pain  . Vaginal Pain  . Nausea   Abdominal Pain  This is a new problem. The current episode started in the past 7 days. The problem occurs constantly. The problem has been unchanged. The pain is located in the suprapubic region, LLQ and RLQ. The pain is at a severity of 6/10. The pain is aggravated by movement. She has tried acetaminophen for the symptoms. The treatment provided no relief.  Vaginal Pain  Associated symptoms include abdominal pain.  She said that pain started during intercourse on Sunday, but it had also been hurting with intercourse a few times before that.   OB History    Gravida  1   Para      Term      Preterm      AB  1   Living        SAB      TAB  1   Ectopic      Multiple      Live Births              Past Medical History:  Diagnosis Date  . Anxiety   . Depression   . Diverticulosis   . Low serum cortisol level (HCC)   . Pancreatitis   . Renal disorder    renal calculi  . Seizure [facility])     Past Surgical History:  Procedure Laterality Date  . NO PAST SURGERIES    . None      Family History  Problem Relation Age of Onset  . Diabetes Mother   . Heart disease Mother   . Hypertension Mother   . Diverticulitis Mother     Social History   Tobacco Use  . Smoking status: Former Smoker    Packs/day: 0.30    Years: 9.00    Pack years: 2.70    Types: Cigarettes    Last attempt to quit: 07/04/2012    Years since quitting: 5.3  . Smokeless tobacco: Never  Used  Substance Use Topics  . Alcohol use: Yes    Comment: occasional  . Drug use: No    Types: Marijuana    Comment: none since 07/2012    Allergies:  Allergies  Allergen Reactions  . Sulfa Antibiotics Nausea And Vomiting    Medications Prior to Admission  Medication Sig Dispense Refill Last Dose  . albuterol (PROVENTIL HFA;VENTOLIN HFA) 108 (90 Base) MCG/ACT inhaler Inhale 1-2 puffs into the lungs every 6 (six) hours as needed for wheezing or shortness of breath. 1 Inhaler 0   . benzonatate (TESSALON) 100 MG capsule Take 1 capsule (100 mg total) by mouth every 8 (eight) hours. 21 capsule 0   . cetirizine-pseudoephedrine (ZYRTEC-D) 5-120 MG tablet Take 1 tablet by mouth daily. 15 tablet 0   . fluticasone (FLONASE) 50 MCG/ACT nasal spray Place 2 sprays into both nostrils daily. 1 g 0   . ondansetron (ZOFRAN ODT) 4 MG disintegrating tablet Take 1  tablet (4 mg total) by mouth every 8 (eight) hours as needed for nausea or vomiting. 10 tablet 0     Review of Systems  HENT: Negative.   Gastrointestinal: Positive for abdominal pain.  Genitourinary: Positive for vaginal pain.  Musculoskeletal: Negative.   Psychiatric/Behavioral: Negative.    Physical Exam   Blood pressure 117/76, pulse 79, temperature 98.8 F (37.1 C), temperature source Oral, resp. rate 18, weight 144 lb 4 oz (65.4 kg), SpO2 99 %.  Physical Exam  Constitutional: She appears well-developed.  HENT:  Head: Normocephalic.  Neck: Normal range of motion.  Respiratory: Effort normal.  GI: Soft.  Genitourinary:  Genitourinary Comments: Normal external female genitalia; IUD strings visualized. No discharge, blood in the vagina. No lesions; Cervix is pink without lesions, no CMT, suprapubic and adnexal tenderness.   Neurological: She is alert.  Skin: Skin is warm and dry.    MAU Course  Procedures  MDM -wet prep, GC chlamydia not done per patient's request and patient's has no history of STIs.  -US deferred as  pain pelvic exam unremarkable and have low suspicion for torsion and patient is concerned about cost. Discussed with patient that she may have cysts, which is a common side effect from Mirena.  Assessment and Plan   1. Pelvic pain    2.  Patient stable for discharge. RX for percocet to fill if she needs it. Advised heat, ibuprofen 800 mg q 8 hours for pain.   3.  Patient will return to MAU if her symptoms get worse; all questions answered.   4. [facility] to schedule patient for follow-up and to fill out charity care for outpatient US. Discussed with Dorathy KinsmanVirginia Smith, who recommends patient have appt with MD to talk about her symptoms, fill out her paperwork for charity care and then have an outpatient US ordered to check IUD.   Charlesetta GaribaldiKathryn Lorraine Kooistra 11/24/2017, 4:47 PM

## 2017-11-27 ENCOUNTER — Telehealth: Payer: Self-pay | Admitting: Family Medicine

## 2017-11-27 NOTE — Telephone Encounter (Signed)
Called patient, no answer- left message stating we are trying to reach you to return your phone call, please call us back. Per chart review, scheduling an ultrasound was discussed with patient in the MAU but she declined at the time due to concern of cost/lack of insurance

## 2017-11-27 NOTE — Telephone Encounter (Signed)
Patient called to say she needed to get her US scheduled. She canceled her vacation to get this done. Please call ASAP.

## 2017-11-30 ENCOUNTER — Encounter: Payer: Self-pay | Admitting: Obstetrics and Gynecology

## 2017-12-01 ENCOUNTER — Telehealth: Payer: Self-pay

## 2017-12-01 NOTE — Telephone Encounter (Signed)
Pt called stated that she went to MAU and they were suppose to schedule an US and has not been scheduled.

## 2017-12-04 ENCOUNTER — Other Ambulatory Visit: Payer: Self-pay | Admitting: General Practice

## 2017-12-04 DIAGNOSIS — R102 Pelvic and perineal pain: Secondary | ICD-10-CM

## 2017-12-04 NOTE — Progress Notes (Signed)
Called patient, no answer- left message on voicemail stating we are trying to reach you to return your phone call. You can schedule that appointment yourself if you'd like at [phone].

## 2017-12-22 ENCOUNTER — Telehealth: Payer: Self-pay

## 2017-12-22 NOTE — Telephone Encounter (Signed)
Pt called stating that she had no idea that she had an appt scheduled for 12/24/17, pt wants to cancel due to her already having an appt already scheduled on 12/23/17 somewhere else. So front Desk canceled appt.

## 2017-12-24 ENCOUNTER — Encounter: Payer: Medicaid Other | Admitting: Obstetrics and Gynecology

## 2019-03-28 ENCOUNTER — Other Ambulatory Visit: Payer: Self-pay

## 2019-03-28 DIAGNOSIS — Z20822 Contact with and (suspected) exposure to covid-19: Secondary | ICD-10-CM

## 2019-03-30 LAB — NOVEL CORONAVIRUS, NAA: SARS-CoV-2, NAA: NOT DETECTED

## 2019-07-21 ENCOUNTER — Other Ambulatory Visit: Payer: Self-pay

## 2019-07-21 ENCOUNTER — Encounter: Payer: Self-pay | Admitting: Internal Medicine

## 2019-07-21 ENCOUNTER — Ambulatory Visit: Payer: Medicaid Other | Admitting: Internal Medicine

## 2019-07-21 VITALS — BP 112/82 | HR 70 | Resp 12 | Ht 63.0 in | Wt 143.0 lb

## 2019-07-21 DIAGNOSIS — N289 Disorder of kidney and ureter, unspecified: Secondary | ICD-10-CM | POA: Insufficient documentation

## 2019-07-21 DIAGNOSIS — F329 Major depressive disorder, single episode, unspecified: Secondary | ICD-10-CM | POA: Insufficient documentation

## 2019-07-21 DIAGNOSIS — M5412 Radiculopathy, cervical region: Secondary | ICD-10-CM

## 2019-07-21 DIAGNOSIS — F32A Depression, unspecified: Secondary | ICD-10-CM | POA: Insufficient documentation

## 2019-07-21 DIAGNOSIS — M5442 Lumbago with sciatica, left side: Secondary | ICD-10-CM

## 2019-07-21 DIAGNOSIS — G8929 Other chronic pain: Secondary | ICD-10-CM

## 2019-07-21 NOTE — Progress Notes (Signed)
Subjective:    Patient ID: Paige Fields, female   DOB: 08-29-1980, 39 y.o.   MRN: [id number]   HPI   Here to establish  Back pain for 4-5 years. 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  She notes when she lifts arms over her head that she gets numbness and tingling and weakness--more so on ulnar aspect and radiating down to pinky side of hand.  She has more recently had some numbness in her thumb.  The inside of thumb is chronically numb over past month.   Has never had a neck injury.   Worked on cement floors for years.  No acute injury.  Most of her pain is on the left, but can have on right.  She can have pain in the lateral low buttock and on the left, can radiate down the lateral left thigh.  No history really of weakness in her legs.  She does note more symptoms with walking up hill as opposed to downhill.   No urinary or stool incontinence.    Has never sought medical attention for this. Has tried to manage pain with ibuprofen until developed a stomach ulcer early on in the process. Has used Kratom for the pain.  Taking several times a day, which helps for several hours with upper back pain, but not the lower back pain. She does do yoga daily.  Has never had PT. Went to a chiropractor once--did not have a good experience where she went.   Tried Acupuncture--maybe helped a little, but only went once.  No outpatient medications have been marked as taking for the 07/21/19 encounter (Office Visit) with Julieanne Manson, MD.   Allergies  Allergen Reactions  . Sulfa Antibiotics Nausea And Vomiting   Past Medical History:  Diagnosis Date  . Anxiety   . Depression   . Diverticulosis    Not clear how diagnosed--no history of colonoscopy.  Unable to find in CT or other radiologic studies in chart.  . Low serum cortisol level (HCC)   . Pancreatitis 03/2012   Not clear what was felt to be the cause.  . Renal  disorder 2012   renal calculi--calcium containing  . Seizure (HCC) 2015   Secondary to Wellbutrin    Past Surgical History:  Procedure Laterality Date  . NO PAST SURGERIES    . None     Family History  Problem Relation Age of Onset  . Diabetes Mother   . Heart disease Mother   . Hypertension Mother   . Diverticulitis Mother     Social History   Socioeconomic History  . Marital status: Single    Spouse name: Not on file  . Number of children: 0  . Years of education: 36  . Highest education level: Not on file  Occupational History  . Occupation: Unemployed due to [employer]  Tobacco Use  . Smoking status: Former Smoker    Packs/day: 0.30    Years: 9.00    Pack years: 2.70    Types: Cigarettes    Quit date: 05/26/2016    Years since quitting: 3.2  . Smokeless tobacco: Never Used  Substance and Sexual Activity  . Alcohol use: Yes    Comment: occasional  . Drug use: No    Types: Marijuana    Comment: none since 07/2012  . Sexual activity: Yes    Birth control/protection: I.U.D.  Other Topics Concern  . Not  on file  Social History Narrative   Lives with her female partner of 5 years.   Originally from [place].   Social Determinants of Health   Financial Resource Strain:   . Difficulty of Paying Living Expenses:   Food Insecurity: No Food Insecurity  . Worried About Charity fundraiser in the Last Year: Never true  . Ran Out of Food in the Last Year: Never true  Transportation Needs: No Transportation Needs  . Lack of Transportation (Medical): No  . Lack of Transportation (Non-Medical): No  Physical Activity:   . Days of Exercise per Week:   . Minutes of Exercise per Session:   Stress:   . Feeling of Stress :   Social Connections:   . Frequency of Communication with Friends and Family:   . Frequency of Social Gatherings with Friends and Family:   . Attends Religious Services:   . Active Member of Clubs or Organizations:   . Attends Archivist  Meetings:   Marland Kitchen Marital Status:   Intimate Partner Violence: Not At Risk  . Fear of Current or Ex-Partner: No  . Emotionally Abused: No  . Physically Abused: No  . Sexually Abused: No      Review of Systems    Objective:   BP 112/82 (BP Location: Left Arm, Patient Position: Sitting, Cuff Size: Normal)   Pulse 70   Resp 12   Ht 5\' 3"  (1.6 m)   Wt 143 lb (64.9 kg)   LMP 06/21/2019 (Approximate)   BMI 25.33 kg/m   Physical Exam  NAD Lungs:  CTA CV:  RRR with normal S1 and S2, No S3, S4 or murmur.  Radial and DP pulses normal and equal MS:  Moves fluidly.  No reduction of ROM of neck or spine.  Negative Straight leg raise.  Some tenderness across left trap Neuro:  A & O x 3, CN II-XII grossly intact.  DTRs 2+/4 and Motor 5/5 throughout.  Sensory to light touch grossly normal and symmetric. Angled long scars across ventral wrists--left note more so than right Assessment & Plan  1.  Lower cervical/upper T spine left radiculopathy:  Continue her current use of pain relief.   PT referral. Cspine film.  2.  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  History of depression/anxiety:  Offered treatment for depression/anxiety/trauma in future if needed.  Currently not interested.

## 2019-07-22 ENCOUNTER — Ambulatory Visit: Payer: Self-pay | Admitting: Internal Medicine

## 2019-07-25 ENCOUNTER — Other Ambulatory Visit: Payer: Self-pay

## 2019-07-25 ENCOUNTER — Ambulatory Visit
Admission: RE | Admit: 2019-07-25 | Discharge: 2019-07-25 | Disposition: A | Discharge status: Home / Self Care | Payer: No Typology Code available for payment source | Source: Ambulatory Visit | Attending: Internal Medicine | Admitting: Internal Medicine

## 2019-07-25 DIAGNOSIS — G8929 Other chronic pain: Secondary | ICD-10-CM

## 2019-07-25 DIAGNOSIS — M5412 Radiculopathy, cervical region: Secondary | ICD-10-CM

## 2019-08-01 ENCOUNTER — Telehealth: Payer: Self-pay | Admitting: Internal Medicine

## 2019-08-01 NOTE — Telephone Encounter (Signed)
Patient called requesting results on DG lumbar spine and DG cervical spine done on 07/25/2019.

## 2019-08-01 NOTE — Telephone Encounter (Signed)
To Dr. Mulberry to review results 

## 2019-08-04 ENCOUNTER — Other Ambulatory Visit (INDEPENDENT_AMBULATORY_CARE_PROVIDER_SITE_OTHER): Payer: Self-pay

## 2019-08-04 DIAGNOSIS — Z13220 Encounter for screening for lipoid disorders: Secondary | ICD-10-CM

## 2019-08-04 DIAGNOSIS — Z79899 Other long term (current) drug therapy: Secondary | ICD-10-CM

## 2019-08-05 LAB — COMPREHENSIVE METABOLIC PANEL
ALT: 14 IU/L (ref 0–32)
AST: 13 IU/L (ref 0–40)
Albumin/Globulin Ratio: 1.6 (ref 1.2–2.2)
Albumin: 4.2 g/dL (ref 3.8–4.8)
Alkaline Phosphatase: 74 IU/L (ref 39–117)
BUN/Creatinine Ratio: 19 (ref 9–23)
BUN: 12 mg/dL (ref 6–24)
Bilirubin Total: 0.6 mg/dL (ref 0.0–1.2)
CO2: 25 mmol/L (ref 20–29)
Calcium: 9.3 mg/dL (ref 8.7–10.2)
Chloride: 104 mmol/L (ref 96–106)
Creatinine, Ser: 0.62 mg/dL (ref 0.57–1.00)
GFR calc Af Amer: 130 mL/min/{1.73_m2} (ref >59)
GFR calc non Af Amer: 113 mL/min/{1.73_m2} (ref >59)
Globulin, Total: 2.6 g/dL (ref 1.5–4.5)
Glucose: 79 mg/dL (ref 65–99)
Potassium: 4.8 mmol/L (ref 3.5–5.2)
Sodium: 142 mmol/L (ref 134–144)
Total Protein: 6.8 g/dL (ref 6.0–8.5)

## 2019-08-05 LAB — CBC WITH DIFFERENTIAL/PLATELET
Basophils Absolute: 0.1 10*3/uL (ref 0.0–0.2)
Basos: 1 %
EOS (ABSOLUTE): 0.1 10*3/uL (ref 0.0–0.4)
Eos: 2 %
Hematocrit: 39.1 % (ref 34.0–46.6)
Hemoglobin: 13 g/dL (ref 11.1–15.9)
Immature Grans (Abs): 0 10*3/uL (ref 0.0–0.1)
Immature Granulocytes: 0 %
Lymphocytes Absolute: 2.9 10*3/uL (ref 0.7–3.1)
Lymphs: 37 %
MCH: 30.9 pg (ref 26.6–33.0)
MCHC: 33.2 g/dL (ref 31.5–35.7)
MCV: 93 fL (ref 79–97)
Monocytes Absolute: 0.7 10*3/uL (ref 0.1–0.9)
Monocytes: 10 %
Neutrophils Absolute: 3.8 10*3/uL (ref 1.4–7.0)
Neutrophils: 50 %
Platelets: 321 10*3/uL (ref 150–450)
RBC: 4.21 x10E6/uL (ref 3.77–5.28)
RDW: 12.5 % (ref 11.7–15.4)
WBC: 7.7 10*3/uL (ref 3.4–10.8)

## 2019-08-05 LAB — LIPID PANEL W/O CHOL/HDL RATIO
Cholesterol, Total: 178 mg/dL (ref 100–199)
HDL: 51 mg/dL (ref >39)
LDL Chol Calc (NIH): 113 mg/dL — ABNORMAL HIGH (ref 0–99)
Triglycerides: 73 mg/dL (ref 0–149)
VLDL Cholesterol Cal: 14 mg/dL (ref 5–40)

## 2019-08-15 ENCOUNTER — Telehealth: Payer: Self-pay | Admitting: Internal Medicine

## 2019-08-15 NOTE — Telephone Encounter (Signed)
Patient called inquiring  results on DG lumbar spine and DG cervical spine done on 07/25/2019. patient stated has been more than 3 weeks had X-rays done; called last week to get results with no reply just yet.

## 2019-08-16 NOTE — Telephone Encounter (Signed)
To Dr. Delrae Alfred to go over results

## 2019-08-30 ENCOUNTER — Encounter: Payer: Self-pay | Admitting: Internal Medicine

## 2019-08-30 ENCOUNTER — Telehealth: Payer: Self-pay | Admitting: Internal Medicine

## 2019-08-30 NOTE — Telephone Encounter (Signed)
Called and discussed her films.  She has yet to start PT and will begin virtual PT visits this Thursday.  Discussed if she does not feel she is getting anything out of it, we can move toward and ortho referral and have her apply for financial assistance through [facility].

## 2019-08-30 NOTE — Telephone Encounter (Signed)
See phone note associated with xray.  Will call back

## 2019-08-31 ENCOUNTER — Encounter: Payer: Self-pay | Admitting: Internal Medicine

## 2019-10-07 NOTE — Telephone Encounter (Signed)
Spoke with pro bono clinic. States they will look and see. If it is any issue they will call back.

## 2019-10-12 ENCOUNTER — Telehealth: Payer: Self-pay | Admitting: Internal Medicine

## 2019-10-12 NOTE — Telephone Encounter (Signed)
Left message for patient to call back  

## 2019-10-12 NOTE — Telephone Encounter (Signed)
Patient called stating has having  Foot pain on/off x about a year and came back about a month ago and will like to be seen for it. Patient informed has an appointment coming up next month and that unfortunately we dot not have any openings before that.  Patient also stating will like to provide update on Physical Therapy referral to Dr. Delrae Alfred. Patient requesting a call back for triage.

## 2019-10-21 NOTE — Telephone Encounter (Signed)
Left another message for patient to call back 

## 2019-11-16 ENCOUNTER — Encounter: Payer: Self-pay | Admitting: Internal Medicine

## 2019-11-16 ENCOUNTER — Ambulatory Visit (INDEPENDENT_AMBULATORY_CARE_PROVIDER_SITE_OTHER): Payer: Self-pay | Admitting: Internal Medicine

## 2019-11-16 VITALS — BP 110/80 | HR 70 | Resp 12 | Ht 63.0 in | Wt 137.0 lb

## 2019-11-16 DIAGNOSIS — N159 Renal tubulo-interstitial disease, unspecified: Secondary | ICD-10-CM | POA: Insufficient documentation

## 2019-11-16 DIAGNOSIS — K2100 Gastro-esophageal reflux disease with esophagitis, without bleeding: Secondary | ICD-10-CM

## 2019-11-16 DIAGNOSIS — M5412 Radiculopathy, cervical region: Secondary | ICD-10-CM

## 2019-11-16 DIAGNOSIS — Z0000 Encounter for general adult medical examination without abnormal findings: Secondary | ICD-10-CM

## 2019-11-16 DIAGNOSIS — K029 Dental caries, unspecified: Secondary | ICD-10-CM

## 2019-11-16 MED ORDER — FAMOTIDINE 40 MG PO TABS: ORAL_TABLET | ORAL | 11 refills | Status: AC

## 2019-11-16 MED ORDER — CYCLOBENZAPRINE HCL 10 MG PO TABS: ORAL_TABLET | ORAL | 0 refills | Status: AC

## 2019-11-16 NOTE — Progress Notes (Signed)
Subjective:    Patient ID: Paige Fields, female   DOB: 1980/11/08, 39 y.o.   MRN: [id number]   HPI   CPE without pap  1.  Pap:  Last was October 26, 2019 at [facility].  Had IUD Mirena 6 year replaced yesterday.  She does not know the results of pap.  Had BV with this last visit.  Maybe had HPV vaccination as a teenager.    2.  Mammogram:  Never.  No family history of breast cancer.    3.  Osteoprevention: Does not drink dairy regularly or other forms of milk, but willing to consider almond milk 8 oz 3-4 times daily.  Does hike and do yoga.   No family history of osteoporosis.    4.  Guaiac Cards:  Never.    5.  Colonoscopy:  Never.  No family history of colon cancer.  Mother had part of colon removed in past for "colitis"  May have been for diverticular disease.    6.  Immunizations:  Cannot remember last tetanus vaccine.   Received 1st half of COVID vaccine, but did not go back for the 2nd   Immunization History  Administered Date(s) Administered  . PFIZER SARS-COV-2 Vaccination 08/10/2019     7.  Glucose/Cholesterol:  Cholesterol and glucose fasting were fine in March 2021.  Other:  Neck and back pain:  Was getting virtual PT visits--mainly yoga poses.  Just started with in person visits last week and will be going weekly.  Was given band exercises for strengthening.  Taking Kratom, which helps with pain.  Current Meds  Medication Sig  . ibuprofen (ADVIL) 200 MG tablet Take 200 mg by mouth every 6 (six) hours as needed.  Marland Kitchen levonorgestrel (MIRENA) 20 MCG/24HR IUD 1 each by Intrauterine route once.   Marland Kitchen omeprazole (PRILOSEC) 20 MG capsule Take 20 mg by mouth daily.     Allergies  Allergen Reactions  . Sulfa Antibiotics Nausea And Vomiting   Past Medical History:  Diagnosis Date  . Anxiety   . Depression   . Diverticulosis    Not clear how diagnosed--no history of colonoscopy.  Unable to find in CT or other radiologic studies in chart.  . Kidney infection    resulting  in sepsis  . Low serum cortisol level (HCC)   . Pancreatitis 03/2012   Not clear what was felt to be the cause.  . Renal disorder 2012   renal calculi--calcium containing  . Seizure ([place]) 2015   Secondary to Wellbutrin    Past Surgical History:  Procedure Laterality Date  . NO PAST SURGERIES    . None      Family History  Problem Relation Age of Onset  . Diabetes Mother   . Heart disease Mother   . Hypertension Mother   . Diverticulitis Mother     Social History   Socioeconomic History  . Marital status: Soil scientist    Spouse name: Karter Cardinal  . Number of children: 0  . Years of education: 79  . Highest education level: Not on file  Occupational History  . 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  Tobacco Use  . Smoking status: Former Smoker    Packs/day: 0.30    Years: 9.00    Pack years: 2.70    Types: Cigarettes    Quit date: 05/26/2016    Years since quitting: 3.4  . Smokeless tobacco: Never Used  Vaping Use  . Vaping Use:  Never used  Substance and Sexual Activity  . Alcohol use: Yes    Comment: occasional  . Drug use: No    Types: Marijuana    Comment: none since 07/2012  . Sexual activity: Yes    Birth control/protection: I.U.D.  Other Topics Concern  . Not on file  Social History Narrative   Lives with her female partner of 5 years (2021).   Originally from [place].   Social Determinants of Health   Financial Resource Strain:   . Difficulty of Paying Living Expenses:   Food Insecurity: No Food Insecurity  . Worried About Programme researcher, broadcasting/film/video in the Last Year: Never true  . Ran Out of Food in the Last Year: Never true  Transportation Needs: No Transportation Needs  . Lack of Transportation (Medical): No  . Lack of Transportation (Non-Medical): No  Physical Activity:   . Days of Exercise per Week:   . Minutes of Exercise per Session:   Stress:   . Feeling of Stress :   Social Connections:   . Frequency  of Communication with Friends and Family:   . Frequency of Social Gatherings with Friends and Family:   . Attends Religious Services:   . Active Member of Clubs or Organizations:   . Attends Banker Meetings:   Marland Kitchen Marital Status:   Intimate Partner Violence: Not At Risk  . Fear of Current or Ex-Partner: No  . Emotionally Abused: No  . Physically Abused: No  . Sexually Abused: No     Review of Systems  Constitutional: Negative for appetite change and fatigue.  HENT: Positive for dental problem (Has some dental caries.), ear pain (Pain with high pitched sounds.  ) and sore throat (Has acid reflux and awakens with sore throat.  Taking Omeprazole 20 mg as needed for this.). Negative for hearing loss.   Eyes: Positive for visual disturbance (prescription glasses).  Respiratory: Negative for shortness of breath.   Cardiovascular: Negative for chest pain, palpitations and leg swelling.  Gastrointestinal: Positive for abdominal pain (some stomach pains if eats certain things--bread, cheese.  Causes sharp pain in RUQ at times.  ) and blood in stool (Bright red blood in toilet water after BM and on tissue.  Had 3-4 episodes, the last 6 months ago.). Negative for constipation and diarrhea.  Genitourinary: Negative for dysuria and vaginal discharge.  Musculoskeletal:       Knee stiffness at times.  Especially after standing.  Neurological: Negative for weakness.       Numbness in left arm from pinched nerve.  Psychiatric/Behavioral: The patient is nervous/anxious (Feels she is doing okay with this now.  Nothing too significant.).       Objective:   BP 110/80 (BP Location: Left Arm, Patient Position: Sitting, Cuff Size: Normal)   Pulse 70   Resp 12   Ht 5\' 3"  (1.6 m)   Wt 137 lb (62.1 kg)   LMP  (LMP Unknown)   BMI 24.27 kg/m   Physical Exam Chaperone present: Breast exam deferred as performed recently at [facility].  Constitutional:      Appearance: Normal appearance. She is  normal weight.  HENT:     Head: Normocephalic.     Right Ear: Hearing, tympanic membrane, ear canal and external ear normal.     Left Ear: Hearing, tympanic membrane, ear canal and external ear normal.     Nose: Nose normal.     Mouth/Throat:     Mouth: Mucous membranes are moist.  Dentition: Dental caries present.     Pharynx: Oropharynx is clear.  Eyes:     Extraocular Movements: Extraocular movements intact.     Conjunctiva/sclera: Conjunctivae normal.     Pupils: Pupils are equal, round, and reactive to light.     Comments: Discs sharp bilaterally.  Cardiovascular:     Rate and Rhythm: Normal rate and regular rhythm.     Heart sounds: S1 normal and S2 normal. No murmur heard.  No friction rub. No S3 or S4 sounds.      Comments: No carotid bruits.  Carotid, radial, femoral, DP and PT pulses normal and equal.  Pulmonary:     Effort: Pulmonary effort is normal.     Breath sounds: Normal breath sounds.  Abdominal:     General: Abdomen is flat. Bowel sounds are normal.     Palpations: Abdomen is soft. There is no hepatomegaly, splenomegaly or mass.     Tenderness: There is no abdominal tenderness.     Hernia: No hernia is present.  Genitourinary:    Comments: Exam deferred as recently performed at [facility]. Musculoskeletal:        General: Normal range of motion.     Cervical back: Full passive range of motion without pain, normal range of motion and neck supple.     Right lower leg: No edema.     Left lower leg: No edema.  Lymphadenopathy:     Head:     Right side of head: No submental or submandibular adenopathy.     Left side of head: No submental or submandibular adenopathy.     Cervical: No cervical adenopathy.     Upper Body:     Right upper body: No supraclavicular or axillary adenopathy.     Left upper body: No supraclavicular or axillary adenopathy.     Lower Body: No right inguinal adenopathy. No left inguinal adenopathy.  Skin:    General: Skin is warm.      Capillary Refill: Capillary refill takes less than 2 seconds.  Neurological:     Mental Status: She is alert and oriented to person, place, and time.     Cranial Nerves: Cranial nerves are intact.     Sensory: Sensation is intact.     Motor: Motor function is intact.     Coordination: Coordination is intact.     Gait: Gait is intact.     Deep Tendon Reflexes: Reflexes are normal and symmetric.  Psychiatric:        Attention and Perception: Attention and perception normal.        Mood and Affect: Mood normal.        Speech: Speech normal.        Behavior: Behavior normal. Behavior is cooperative.        Thought Content: Thought content normal.        Cognition and Memory: Cognition normal.        Judgment: Judgment normal.    No breast or GU exam as done recently at [facility]  Assessment & Plan   1.  CPE without pap as done at [facility] Guaiac cards x 3 to return in 2 weeks. Elects to wait on baseline mammogram If does not drink enough milk, to take Citrated calcium with Vitamin D twice daily   2.  Distant history of BRBPR:  Guaiac cards as above.  Recent CBC in March fine.  3.  Cervical radiculopathy with muscle spasm of upper back:  Cyclobenzaprine as needed at bedtime  when pain causing difficulty with sleep.  Continue with PT.  4.  GERD:  Recommended moving away from PPI.  Reflux precautions with food and sleep.  Famotidine 40 mg at bedtime   5.  Dental decay and pain:  Dental clinic referral.

## 2019-11-16 NOTE — Patient Instructions (Signed)
Citrated Calcium with Vitamin D 500 mg with 200 IU Vitamin D twice daily.  Can google "advance directives, [place]"  And bring up form from Secretary of [place]. Print and fill out Or can go to "5 wishes"  Which is also in Spanish and fill out--this costs $5--perhaps easier to use. Designate a Medical Power of Attorney to speak for you if you are unable to speak for yourself when ill or injured

## 2019-11-23 DIAGNOSIS — K2100 Gastro-esophageal reflux disease with esophagitis, without bleeding: Secondary | ICD-10-CM | POA: Insufficient documentation

## 2019-12-08 ENCOUNTER — Other Ambulatory Visit (INDEPENDENT_AMBULATORY_CARE_PROVIDER_SITE_OTHER): Payer: Medicaid Other

## 2019-12-08 DIAGNOSIS — Z1211 Encounter for screening for malignant neoplasm of colon: Secondary | ICD-10-CM

## 2019-12-08 LAB — POC HEMOCCULT BLD/STL (HOME/3-CARD/SCREEN)
Card #2 Fecal Occult Blod, POC: NEGATIVE
Card #3 Fecal Occult Blood, POC: NEGATIVE
Fecal Occult Blood, POC: NEGATIVE

## 2020-02-17 ENCOUNTER — Ambulatory Visit: Payer: Medicaid Other | Admitting: Internal Medicine

## 2021-11-02 IMAGING — CR DG CERVICAL SPINE COMPLETE 4+V
5 series · 5 of 5 positions shown · non-contrast
Comparison: None.

CLINICAL DATA: Radiculopathy

EXAM:
CERVICAL SPINE - COMPLETE 4+ VIEW

[w cervical spine lat]
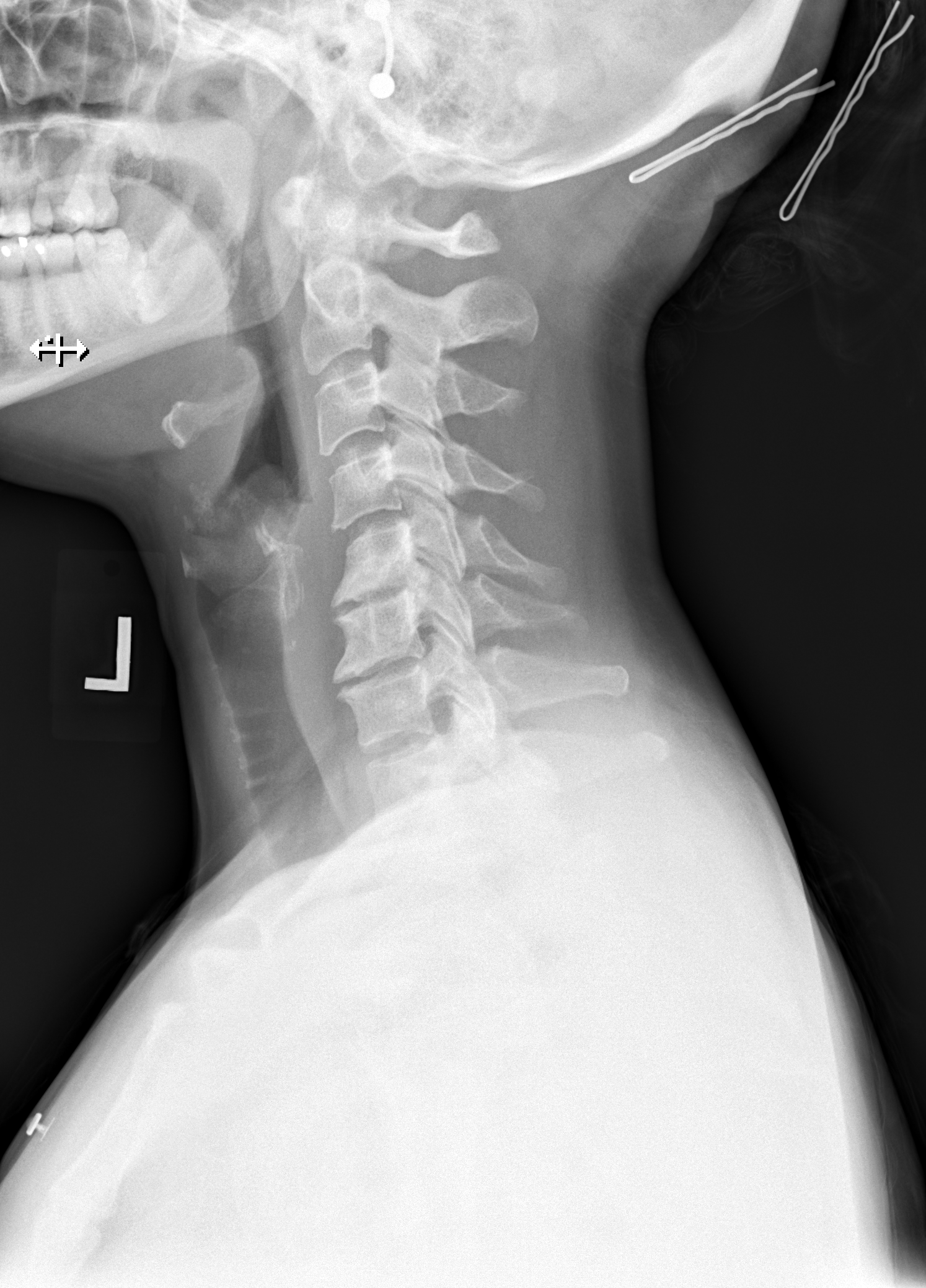

[w cervical spine ap_obl (1 of 2)]
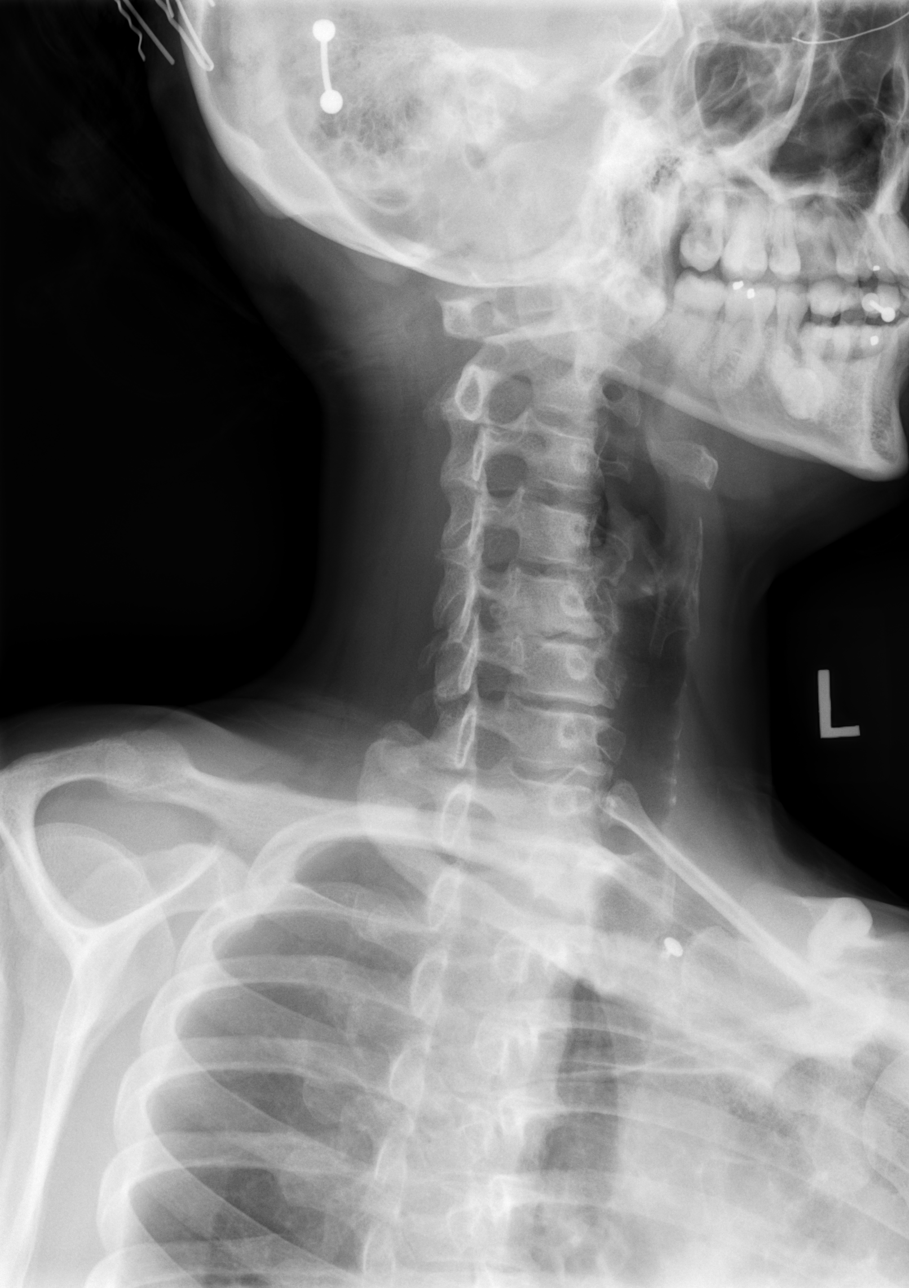

[w cervical spine ap_obl (2 of 2)]
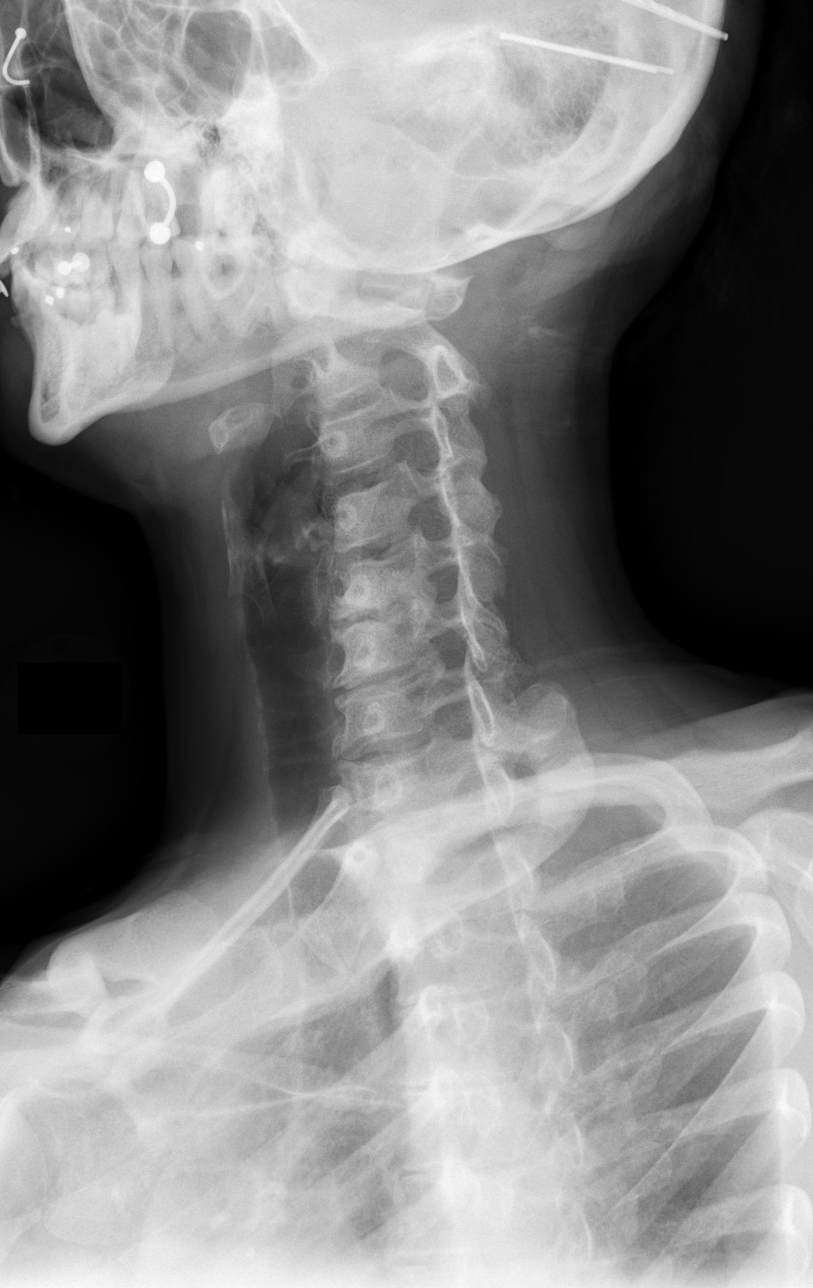

[w cervical spine ap]
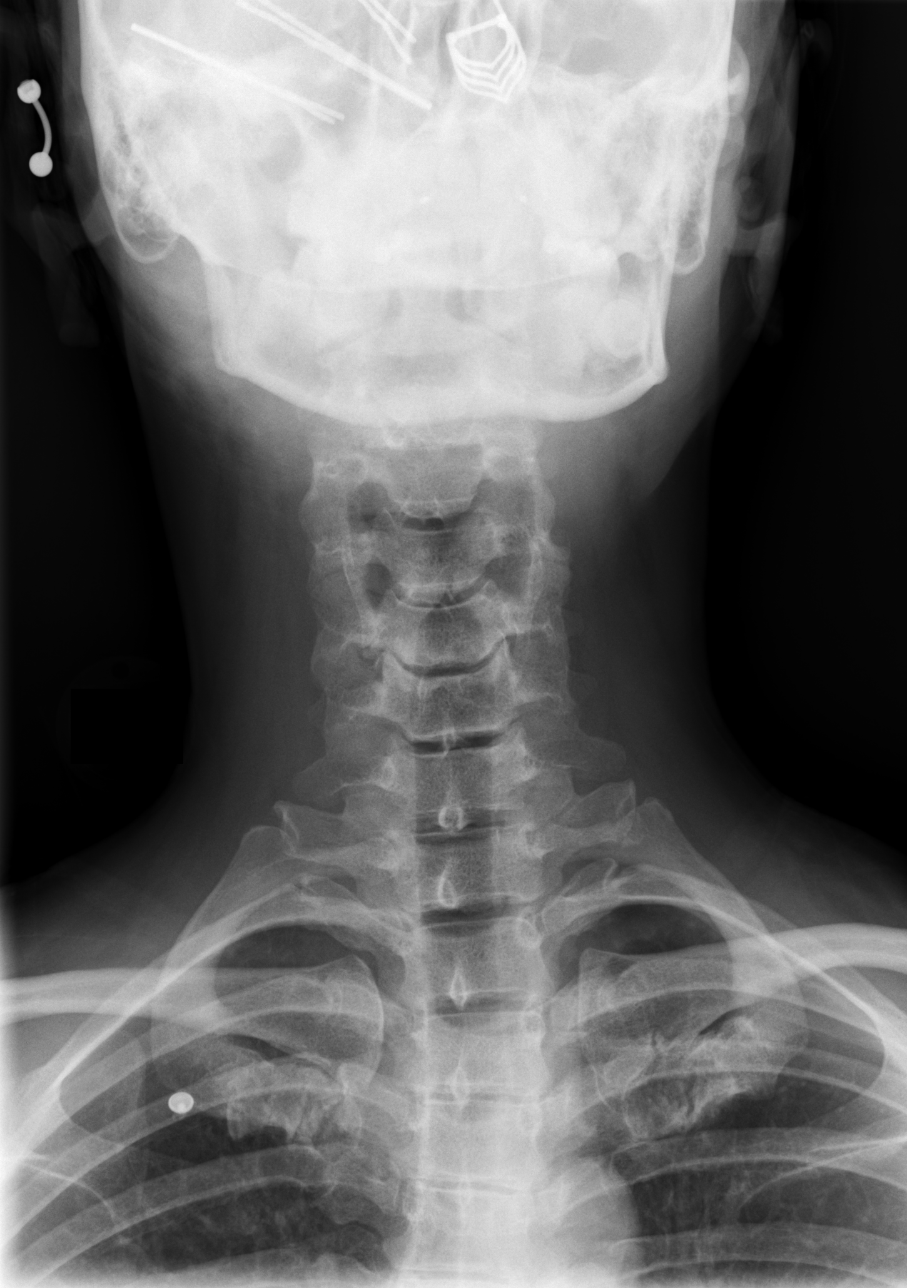

[w cervical spine odontoid]
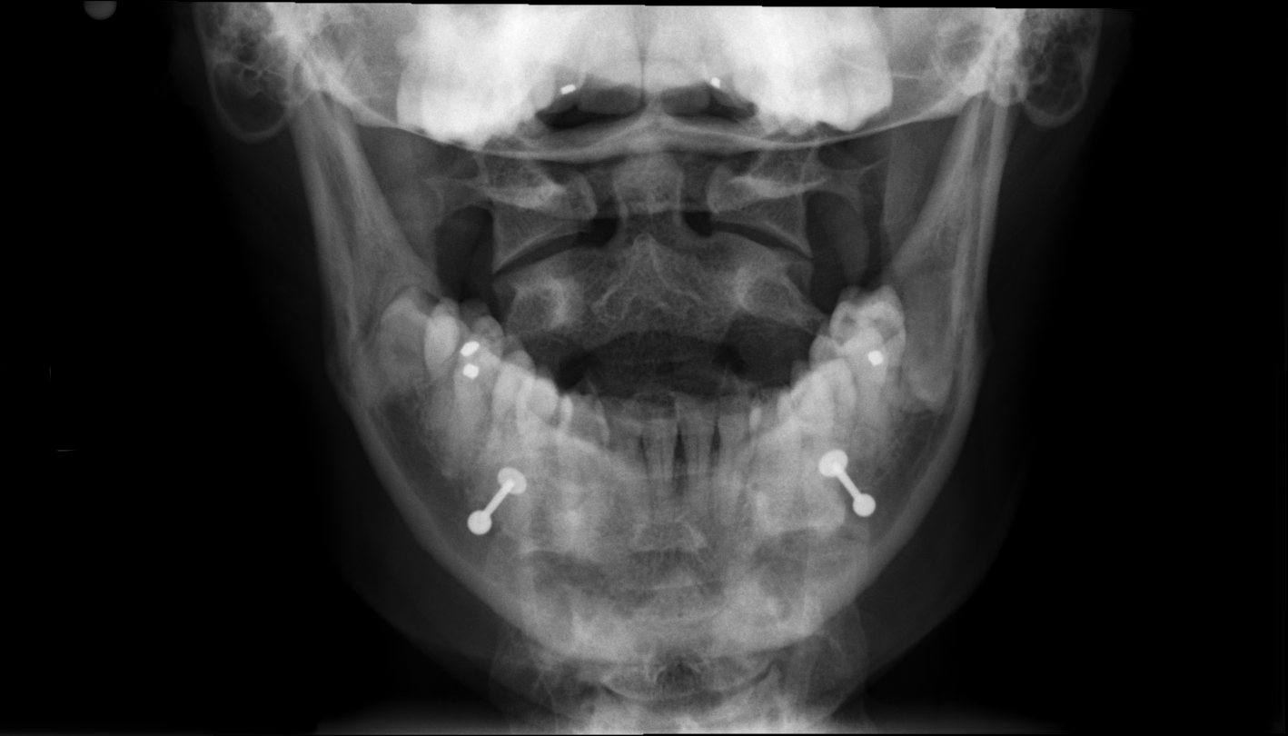

[5 of 5 positions shown; findings below may reference images not displayed]

FINDINGS: Straightening of the cervical spine. Vertebral body heights are
maintained. Normal prevertebral soft tissue thickness. Dens and
lateral masses are within normal limits. Moderate disc space
narrowing and osteophyte at C5-C6 and C6-C7. Bilateral foraminal
narrowing at C5-C6 right greater than left.
IMPRESSION: Straightening of the cervical spine with moderate degenerative
changes at C5-C6 and C6-C7.
